# Patient Record
Sex: Female | Born: 1945
Health system: Southern US, Community
[De-identification: ages and names within clinical notes are randomized; demographics above are authoritative.]

## PROBLEM LIST (undated history)

## (undated) DIAGNOSIS — R569 Unspecified convulsions: Secondary | ICD-10-CM

## (undated) DIAGNOSIS — G459 Transient cerebral ischemic attack, unspecified: Secondary | ICD-10-CM

## (undated) DIAGNOSIS — E785 Hyperlipidemia, unspecified: Secondary | ICD-10-CM

## (undated) HISTORY — PX: TONSILLECTOMY: SUR1361

## (undated) HISTORY — DX: Transient cerebral ischemic attack, unspecified: G45.9

## (undated) HISTORY — DX: Hyperlipidemia, unspecified: E78.5

## (undated) HISTORY — PX: EYE SURGERY: SHX253

---

## 1969-03-03 HISTORY — PX: BREAST BIOPSY: SHX20

## 1998-04-03 ENCOUNTER — Other Ambulatory Visit: Admission: RE | Admit: 1998-04-03 | Discharge: 1998-04-03 | Payer: Self-pay | Admitting: Obstetrics & Gynecology

## 1998-05-08 ENCOUNTER — Other Ambulatory Visit: Admission: RE | Admit: 1998-05-08 | Discharge: 1998-05-08 | Payer: Self-pay | Admitting: Obstetrics & Gynecology

## 1999-06-04 ENCOUNTER — Other Ambulatory Visit: Admission: RE | Admit: 1999-06-04 | Discharge: 1999-06-04 | Payer: Self-pay | Admitting: Obstetrics & Gynecology

## 2000-06-24 ENCOUNTER — Other Ambulatory Visit: Admission: RE | Admit: 2000-06-24 | Discharge: 2000-06-24 | Payer: Self-pay | Admitting: Obstetrics & Gynecology

## 2001-09-02 ENCOUNTER — Other Ambulatory Visit: Admission: RE | Admit: 2001-09-02 | Discharge: 2001-09-02 | Payer: Self-pay | Admitting: Obstetrics & Gynecology

## 2003-02-21 ENCOUNTER — Other Ambulatory Visit: Admission: RE | Admit: 2003-02-21 | Discharge: 2003-02-21 | Payer: Self-pay | Admitting: Obstetrics & Gynecology

## 2003-06-19 ENCOUNTER — Inpatient Hospital Stay (HOSPITAL_COMMUNITY): Admission: AD | Admit: 2003-06-19 | Discharge: 2003-06-22 | Payer: Self-pay | Admitting: Family Medicine

## 2003-10-11 ENCOUNTER — Emergency Department (HOSPITAL_COMMUNITY): Admission: EM | Admit: 2003-10-11 | Discharge: 2003-10-11 | Payer: Self-pay | Admitting: Emergency Medicine

## 2004-04-03 ENCOUNTER — Other Ambulatory Visit: Admission: RE | Admit: 2004-04-03 | Discharge: 2004-04-03 | Payer: Self-pay | Admitting: Obstetrics & Gynecology

## 2006-11-10 ENCOUNTER — Ambulatory Visit: Payer: Self-pay | Admitting: Gastroenterology

## 2006-12-22 ENCOUNTER — Ambulatory Visit: Payer: Self-pay | Admitting: Gastroenterology

## 2006-12-22 ENCOUNTER — Encounter: Payer: Self-pay | Admitting: Gastroenterology

## 2006-12-22 ENCOUNTER — Ambulatory Visit (HOSPITAL_COMMUNITY): Admission: RE | Admit: 2006-12-22 | Discharge: 2006-12-22 | Payer: Self-pay | Admitting: Gastroenterology

## 2006-12-22 HISTORY — PX: COLONOSCOPY: SHX174

## 2010-07-16 NOTE — Op Note (Signed)
NAMEALLIZON, WOZNICK                ACCOUNT NO.:  [id number]   MEDICAL RECORD NO.:  [id number]          PATIENT TYPE:  AMB   LOCATION:  DAY                           FACILITY:  [facility]   PHYSICIAN:  Kassie Mends, M.D.      DATE OF BIRTH:  1945/04/16   DATE OF PROCEDURE:  12/22/2006  DATE OF DISCHARGE:                               OPERATIVE REPORT   PROCEDURE:  Colonoscopy with cold forceps and snare cautery polypectomy.   INDICATION FOR EXAM:  Ms. Yassin is a 65 year old female who presents  for heme-positive stool.  She has a second-degree relative that had  colon cancer.   FINDINGS:  8 mm sigmoid colon polyp removed via snare cautery.  Otherwise no masses, inflammatory changes, diverticula or AVMs seen.  No  internal hemorrhoids.   DIAGNOSIS:  Ms. Arenas 8 mm sigmoid colon polyp is the likely cause of  her intermittent rectal bleeding and heme-positive stool.   RECOMMENDATIONS:  1. Will call Ms. Gibbon with results of her biopsies.  If her polyp is      adenomatous, then she needs a screening colonoscopy in 5 years.  If      her polyp is adenomatous then, her first degree relatives need a      screening colonoscopy at age 66 and then every 10 years because her      adenomatous polyp diagnosis would come after the age of 65.  2. She should follow high fiber diet.  She is given handout on high-      fiber diet polyps.  3. No aspirin, NSAIDs or anticoagulation for 7 days.   MEDICATIONS:  1. Demerol 50 mg IV.  2. Versed 4 mg IV.   PROCEDURE TECHNIQUE:  Physical exam was performed.  Informed consent was  obtained from the patient after explaining benefits, risks and  alternatives to the procedure.  The patient connected to monitor and  placed in left lateral position.  Continuous oxygen was provided by  nasal cannula and IV medicine administered through an indwelling  cannula.  After administration of sedation and rectal exam, the  patient's rectum was intubated and the  scope  was advanced under direct visualization to the cecum.  The scope  was removed slowly by carefully examining the color, texture, anatomy  and integrity mucosa on the way out.  The patient was recovered in  endoscopy and discharged home in satisfactory condition.      Kassie Mends, M.D.  Electronically Signed     SM/MEDQ  D:  12/22/2006  T:  12/23/2006  Job:  [id number]   cc:   Kingsley Callander. Ouida Sills, MD  Fax: [phone]

## 2010-07-16 NOTE — Consult Note (Signed)
Kristina [name], Kristina [name]                ACCOUNT NO.:  [id number]   MEDICAL RECORD NO.:  [id number]         PATIENT TYPE:  AMB   LOCATION:  DAY                           FACILITY:  [facility]   PHYSICIAN:  Kassie Mends, M.D.      DATE OF BIRTH:  1945-09-14   DATE OF CONSULTATION:  11/10/2006  DATE OF DISCHARGE:                                 CONSULTATION   PHYSICIAN REQUESTING CONSULTATION:  Kingsley Callander. Ouida Sills, MD   GYNECOLOGIST:  Gerrit Friends. Aldona Bar, MD, [facility]   REASON FOR CONSULTATION:  Blood in stool.   HISTORY OF PRESENT ILLNESS:  [name] is a 65 year old Caucasian female,  patient of Dr. Ouida Sills, who presents today for further evaluation of  recent hemoccult-positive stool.  She presents to schedule colonoscopy.  She had a flexible sigmoidoscopy over 10 years ago.  She does not recall  any abnormalities.  She states she had colitis when she was younger  which resolved with a nerve pill.  It sounds like she may have had IBS.  She has done well throughout the years.  She denies any constipation,  diarrhea, melena or rectal bleeding, abdominal pain, nausea or vomiting.  She rarely has heartburn.  Weight is stable.  She believes her paternal  grandmother had colon cancer.   CURRENT MEDICATIONS:  1. Welchol 1250 mg t.i.d.  2. Voltaren 75 mg p.r.n.  3. Aspirin 81 mg daily.  4. Multivitamin daily.  5. Calcium with vitamin D daily.   ALLERGIES:  STATIN DRUGS.  SHE HAD A TIA ON VYTORIN.  SHE STATES SHE HAS  HAD SOME SHORTNESS OF BREATH WITH ELIDEL.   PAST MEDICAL HISTORY:  1. Hypercholesterolemia.  2. History of TIA affecting the right temporal lacunar region felt to      be due to Vytorin, according to the patient.  This was 4 years ago.  3. Remote colitis, possibly IBS.   PAST SURGICAL HISTORY:  Tonsillectomy.   FAMILY HISTORY:  Mother is 72.  She has osteoporosis, hypertension,  hypercholesterolemia.  Father is 50.  He had lung cancer and skin  cancer.  Maternal grandmother  possibly had colon cancer.   SOCIAL HISTORY:  She is married and has 2 children.  She is employed at  [employer].  She is a nonsmoker.  Occasionally has a glass of wine.   REVIEW OF SYSTEMS:  GI:  See HPI for GI.  CONSTITUTIONAL:  No weight loss.  CARDIOPULMONARY:  No chest pain, shortness of breath, palpitations,  chronic cough.  GENITOURINARY:  No dysuria or hematuria.   PHYSICAL EXAMINATION:  VITAL SIGNS:  Weight - 165.  Height - 5 feet 4  inches.  Temperature 98.6 degrees, blood pressure 120/86, pulse 72.  GENERAL:  Pleasant, well-nourished, well-developed, Caucasian female in  no acute distress.  SKIN:  Warm and dry.  No jaundice.  HEENT:  Sclerae are nonicteric.  Oropharyngeal mucosa moist and pink.  No lesions, erythema or exudate.  No lymphadenopathy or thyromegaly.  CHEST:  Lungs clear to auscultation.  CARDIAC:  Regular rate and rhythm.  Normal S1, S2.  No murmurs,  rubs or  gallops.  ABDOMEN:  Positive bowel sounds.  Abdomen is soft, nondistended,  nontender.  No organomegaly or masses.  No rebound tenderness or  guarding.  No abdominal bruits or hernias.  EXTREMITIES:  No edema.   IMPRESSION:  The patient is a 65 year old lady with recent hemoccult-  positive stools in the setting of Voltaren and aspirin use.  She uses  Voltaren only on an as-needed basis however.  She has never had a  complete colonoscopy.  Given recent hemoccult-positive stool, recommend  diagnostic colonoscopy in the near future.  The patient requests  specifically the pill prep.  There is no contraindication for her  receiving this type of bowel prep.   PLAN:  1. Colonoscopy +/- EGD with Dr. Cira Servant in the near future.  2. Will hold her aspirin for 4 days prior to procedure.   I would like to thank Dr. Carylon Perches for allowing [place] to take part in the  care of this patient.      Tana Coast, P.A.      Kassie Mends, M.D.  Electronically Signed    LL/MEDQ  D:  11/10/2006  T:  11/11/2006  Job:   [id number]   cc:   Gerrit Friends. Aldona Bar, M.D.  Fax: [phone]   Kingsley Callander. Ouida Sills, MD  Fax: [phone]

## 2010-07-19 NOTE — H&P (Signed)
NAME:  Kristina [name], MARCELLI NO.:  [id number]   MEDICAL RECORD NO.:  [id number]                   PATIENT TYPE:  INP   LOCATION:  A215                                 FACILITY:  [facility]   PHYSICIAN:  Vida Roller, M.D.                DATE OF BIRTH:  [birth year]   DATE OF ADMISSION:  06/19/2003  DATE OF DISCHARGE:                                HISTORY & PHYSICAL   PRIMARY CARE PHYSICIAN:  Dr. Yetta Numbers in [place], [place].   HISTORY OF PRESENT ILLNESS:  Ms. Dicenso is a patient that I follow in the  cardiology clinic who has hyperlipidemia and is under treatment for this.  She was working in her yard on Saturday morning with her husband when she  had sudden loss of memory while she was working doing some gardening work.  She does not describe any discomfort, any loss of neurologic function, but  has a period of over two and a half hours where she has no recollection of  the events that occurred.  Her husband describes the event as saying that  she was very, very emotionally distraught with crying and unable to really  give a description of why she was crying.  He brought her into the house and  sat her down.  She did not appear to have any significant neurologic  abnormalities.  He contacted the P.A. on call for [facility] who  reassured him that the likelihood of any significant problem was low.  Unfortunately, he choose not to bring the patient to the hospital at that  point under the recommendations of the P.A. and he contacted his neighbor,  who is an emergency medical technician who evaluated her and felt that there  was no evidence neurologically of a stroke and she spent the weekend  basically without any significant activity and had no progression of her  symptoms.  She still does not remember the events that occurred outside  during the gardening episode.  She does, however, describe a little bit of a  headache prior to the  evaluation, which is still persistent in the center of  her head, a little bit like pressure.  She has no numbness anywhere.  She  has no particular sense of unsteadiness.  There are no visual disturbances.  She does not have any numbness or loss of function in her extremities.  She  does report occasionally getting numb hands, but this is not anything that  has been particularly significant in the past.   PAST MEDICAL HISTORY:  Hyperlipidemia, for which she is under my care.  Her  initial set of cholesterol numbers had a total of 255 with triglycerides of  196, HDL of 49 and LDL of 167.  We started her on a low dose of Vytorin 10  and 20 and had significant improvement in her cholesterol numbers and we had  adjusted  her Vytorin up to 10 and 40 the weekend prior to the onset of the  symptoms.  Her other medications include loratadine 10 mg q.h.s. and  Voltaren 75 mg b.i.d.  She occasionally takes Tylenol on a p.r.n. basis.  She is allergic to ELAVIL.  The only other significant is to spontaneous  vaginal deliveries.   FAMILY HISTORY:  Significant for her father being alive at age 42 and has  borderline diabetes and hypertension.  Her mother is alive at age 7 and has  high blood pressure and hyperlipidemia and had a TIA in the past.  She has  one sister who has high blood pressure at age 68 and another sister age 11  without any blood pressure problems.   She is an area Transport planner.  She does like to do yard work.  She is  married.  Her husband is a Office manager guard here at the hospital and is a very  good historian, between the two of them.   REVIEW OF SYSTEMS:  Otherwise noncontributory, except for that reviewed in  the history of present illness.  She does not smoke cigarettes.  She does  not drink any alcohol.  She does not use any illicit drugs.   PHYSICAL EXAMINATION:  GENERAL:  She is a well-developed, well-nourished  white female in no apparent distress who is alert and  oriented times four  and a very good historian.  VITAL SIGNS:  Her blood pressure is 134/90 in her right arm and 136/92 in  her left; her pulse is 52 and regular; she weighs 172 pounds.  HEAD, EARS, EYES, NOSE AND THROAT:  Completely normal.  Funduscopic exam  does not reveal any palpable edema.  Her extraocular movements are intact.  She is normocephalic, atraumatic.  Her pupils are equal, round and reactive  to light and accommodation.  Her cranial nerves II-XII are totally normal  with the exception of some abnormalities in gaze.  With her glasses off she  appears to have some mild one beat nystagmus when moving from a left lateral  to right lateral movement, but otherwise appears to have full range of  motion otherwise.  NECK:  Supple.  There is no jugulovenous distension or carotid bruits.  Her  thyroid appears to be normal size in the midline.  CHEST:  Clear to auscultation bilaterally.  CARDIOVASCULAR:  Regular rate and rhythm with no murmurs, rubs or gallops.  BREASTS:  I did not do a breast exam.  ABDOMEN:  Soft, nontender with normoactive bowel sounds.  EXTREMITIES:  Her pulses are all 2+ throughout without any bruits.  NEUROLOGIC:  A detailed neurologic exam is completely within normal limits.   We have not performed an electrocardiogram.   ASSESSMENT:  It is my opinion that this lady had a TIA and is quite  concerning as she still continues to have a headache.  I am concerned enough  about this situation that I am going to admit her to the hospital to get a  head CT and potentially a head MRI.  I am going to ask for a neurologic  consultation.  She will need an echocardiogram and a set of carotid Dopplers  as well as screening laboratories to include a thyroid function study.  I  have discussed this with her primary care physician, Dr. Artis Delay, who  agrees to admit her to the hospital under his service.     ___________________________________________  Vida Roller, M.D.   JH/MEDQ  D:  06/19/2003  T:  06/19/2003  Job:  [id number]

## 2010-07-19 NOTE — Procedures (Signed)
NAME:  IOANNA, COLQUHOUN NO.:  [id number]   MEDICAL RECORD NO.:  [id number]                   PATIENT TYPE:  INP   LOCATION:  A215                                 FACILITY:  [facility]   PHYSICIAN:  Hatfield Bing, M.D.               DATE OF BIRTH:  1945-10-24   DATE OF PROCEDURE:  06/20/2003  DATE OF DISCHARGE:                                  ECHOCARDIOGRAM   REFERRING PHYSICIAN:  1. Corrie Mckusick.  2. Vida Roller.   INDICATIONS:  A 65 year old woman with TIA.   M-MODE:  1. Aorta 2.5.  2. Left atrium 2.9.  3. Septum 1.3.  4. Posterior wall 1.0.  5. LV diastole 3.7.  6. LV systole 2.3.   FINDINGS:  1. A technically-adequate echocardiographic study.  2. Normal left atrium, right atrium and right ventricle.  3. Minimal aortic valvular sclerosis, slight aortic annular calcification.  4. Normal mitral valve; mild annular calcification.  5. Normal tricuspid valve.  6. Trace regurgitation.  7. Normal internal dimension of the left ventricle; borderline LVH.  8. Normal regional and global LV systolic function.  9. Normal Doppler examination.      ___________________________________________                                            [place] Bing, M.D.   RR/MEDQ  D:  06/20/2003  T:  06/21/2003  Job:  [id number]

## 2010-07-19 NOTE — H&P (Signed)
NAME:  Kristina [name], Kristina [name] NO.:  [id number]   MEDICAL RECORD NO.:  [id number]                   PATIENT TYPE:  INP   LOCATION:  A215                                 FACILITY:  [facility]   PHYSICIAN:  Kirk Ruths, M.D.            DATE OF BIRTH:  Oct 21, 1945   DATE OF ADMISSION:  06/19/2003  DATE OF DISCHARGE:                                HISTORY & PHYSICAL   CHIEF COMPLAINT:  Memory loss.   HISTORY OF PRESENT ILLNESS:  This is a 65 year old white female who is  admitted through Dr. Elissa Hefty office.  Patient states that over the  weekend, on June 17, 2003, patient had taken her new medication, which was  Vytorin 10/40, which had just been increased from 10/20.  She also stated  she had some mild headache and took some aspirin as well as her Voltaren,  which she takes for osteoarthritis.  Patient went out to work in the yard  with her husband and subsequently had an approximately hour to hour and a  half of memory loss.  She states that her husband states that she was  sitting on the bench sobbing, which she says she usually does when she is  angry.  She did not know how she had gotten where she was.  The patient was  taken into the house, where she was, as she describes, in a fog.  Approximately another hour and a half or so, at which time she was having  intermittent memory return and some high anxiety related to this incident.  The patient was seen by Dr. Herbie Baltimore, and he felt that she could possibly  have had a TIA and admitted her to my service.   ALLERGIES:  Patient is allergic to ELAVIL.   CURRENT MEDICATIONS:  Include only Vytorin, as mentioned above, as well as  Voltaren for arthritis and an aspirin daily.   PAST MEDICAL HISTORY:  Hyperlipidemia is the patient's only significant  diagnosis.   REVIEW OF SYSTEMS:  Denies vomiting, chest pain, shortness of breath.  Patient states that she does have some mild nausea and mild headache.   PHYSICAL EXAMINATION:  VITAL SIGNS:  Patient is afebrile.  Blood pressure  140/80, respirations 20, pulse 70 and regular.  GENERAL:  A well-developed white female in no severe distress.  HEENT:  TM's are normal.  Pupils are equal and reactive to light and  accommodation.  Oropharynx benign.  NECK:  Supple without JVD, bruits, or thyromegaly.  LUNGS:  Clear in all areas.  HEART:  Regular rate and rhythm without murmur, rub or gallop.  ABDOMEN:  Soft and nontender.  EXTREMITIES:  Without clubbing, cyanosis or edema.  NEUROLOGIC:  Grossly intact.  Motor, sensory, and reflexes are all  bilaterally equal and full.   ASSESSMENT:  Memory loss, possibly secondary to transient ischemic attack.  Possibly atypical migraine.  Possible Vytorin drug-related.   PLAN:  Patient will be admitted for a CT of the brain, carotid Dopplers, as  well as echocardiogram.  Consult Dr. Gerilyn Pilgrim from neurology and hold  Vytorin at this time.     ___________________________________________                                         Kirk Ruths, M.D.   WMM/MEDQ  D:  06/20/2003  T:  06/20/2003  Job:  [id number]

## 2010-07-19 NOTE — Group Therapy Note (Signed)
NAME:  Kristina [name], Kristina [name]                          ACCOUNT NO.:  [id number]   MEDICAL RECORD NO.:  [id number]                   PATIENT TYPE:  INP   LOCATION:  A215                                 FACILITY:  [facility]   PHYSICIAN:  Kofi A. Gerilyn Pilgrim, M.D.              DATE OF BIRTH:  May 24, 1945   DATE OF PROCEDURE:  06/22/2003  DATE OF DISCHARGE:  06/22/2003                                   PROGRESS NOTE   The patient has had no recurrent spells of amnesia.  She continues to report  problems with abdominal discomfort and nausea.  She also actually has a lot  of anxiety about her clinical condition.  The patient was actually set for  discharge after having this MRI scan then we ordered EEG that was not done,  had an extensive discussion with the family and we decided to let the  patient go home and come to our office later this evening to have an EEG  done.  The results of the EEG were reviewed and there is no abnormal  epileptiform discharges seen, essentially EEG was normal.  The brain MRI was  reviewed and it shows an acute lacunar-type infarct involving the left  mesiotemporal lobe.   ASSESSMENT AND PLAN:  I had an extensive discussion with the family and  review of the data.  I believe the MRI findings with the signal seen on  diffusion-weighted sequence/images supports a diagnosis of lacunar-type  infarct.  The location I agree is apical for a lacunar infarct.  The  location of the mesiotemporal lobe is important for memory and I believe  likely explains her abrupt amnesia associated with her infarct.  The  location is also classic for temporal lobe epilepsy but EEG was totally  normal.  My recommendation is for the patient to continue with antiplatelet  agents - at this time really antiplatelet agent is sufficient as she does  not represent a failure of either Plavix or aspirin.  She does not need  antiepileptic medication.  I would suggest that she avoid the Vytorin or  Zocor as there  is a question mark of whether or not these medications may  have triggered these events.  The husband reported that the patient had some  problems remembering particular short-term memory problems of 6 weeks as she  was taking the Vytorin.  No driving restrictions are needed at this time.  She may return to work in early May.      ___________________________________________                                            Perlie Gold. Gerilyn Pilgrim, M.D.   KAD/MEDQ  D:  06/22/2003  T:  06/22/2003  Job:  [id number]

## 2010-07-19 NOTE — Discharge Summary (Signed)
NAME:  Kristina [name], Kristina [name] NO.:  [id number]   MEDICAL RECORD NO.:  [id number]                   PATIENT TYPE:  INP   LOCATION:  A215                                 FACILITY:  [facility]   PHYSICIAN:  Kirk Ruths, M.D.            DATE OF BIRTH:  05-23-1945   DATE OF ADMISSION:  06/19/2003  DATE OF DISCHARGE:  06/22/2003                                 DISCHARGE SUMMARY   FINAL DIAGNOSES:  1. Amnesia secondary to lacunar infarct versus medication complication.  2. History of hyperlipidemia.   HOSPITAL COURSE:  This is a 65 year old white female who reported having  amnesia of some several hours two days before admission.  It is interesting  to note that her Vytorin had been increased or doubled from 10/20 to 10/40  the day before this episode.  The patient was seeing Dr. Dorethea Clan for a  cardiology followup where she reported this event.  He felt that she needed  to be followed up for a TIA.  The patient was admitted to the floor.  A CT  scan was obtained which was negative.  The patient also underwent carotid  Dopplers.  Although there were some mild calcific changes in the right ICA  proximally, no hemodynamically significant stenosis was seen.  Echocardiogram was normal.  The patient's CBC and MET7 as well as thyroid  studies were normal.  EKG was normal also.  The patient was stable  throughout her stay, having no amnestic spells.  She was seen in  consultation by Dr. Gerilyn Pilgrim.  She underwent EEG as well as MRI.  EEG was  perfectly normal.  MRI suggested a lacunar infarct in the right temporal  area.  It was actually a tiny abnormal focus on the medial aspect of the  left temporal lobe.  The patient, again, was stable throughout her stay  without any neurological problems.  The patient's final diagnosis is still  uncertain.  Dr. Gerilyn Pilgrim feels that is probably a TIA with a lacunar infarct  and wants the patient treated with Plavix daily but also suggested  avoiding  Zocor because it has been associated rarely with amnestic side effects, and  this did occur the day after her dose was increased.     ___________________________________________                                         Kirk Ruths, M.D.   WMM/MEDQ  D:  07/04/2003  T:  07/05/2003  Job:  [id number]

## 2010-07-19 NOTE — Consult Note (Signed)
NAME:  Kristina [name], Kristina [name]                          ACCOUNT NO.:  [id number]   MEDICAL RECORD NO.:  [id number]                   PATIENT TYPE:  INP   LOCATION:  A215                                 FACILITY:  [facility]   PHYSICIAN:  Kofi A. Gerilyn Pilgrim, M.D.              DATE OF BIRTH:  March 06, 1945   DATE OF CONSULTATION:  DATE OF DISCHARGE:                                   CONSULTATION   REFERRING PHYSICIAN:  Dr. Kirk Ruths.   IMPRESSION:  Transient neurological deficit/amnesia.  The differential  diagnosis includes a transient ischemic attack or even a frank  cerebrovascular event, seizures, transient global amnesia, complex migraine  and medication effect, somewhat unclear which one may be a leading  diagnosis.  Given the close proximity of this event occurring with increase  of the Vytorin, I would suspect that this may be related to a medication  effect.  There apparently have been some reports of transient global amnesia  occurring with statin medications.   RECOMMENDATION:  1. EEG.  2. Brain MRI.   HISTORY:  This is a 65 year old Caucasian female who has a history of  dyslipidemia and apparently has been on Lipitor.  This medication was  switched to Vytorin.  She was taking the 10/20 dose.  This medication was  increased last Friday.  The following day, the patient had an event; she, in  fact, took the medication on a Friday night and had the event the following  morning after taking Tylenol apparently for some arthritic pain.  The  patient was found sitting outside in a chair sobbing and crying.  She had no  recollection of what happened.  Her husband reports that the patient seemed  to be fine; she was talking and was responsive; there was no slurred speech  reported; no focal neurologic deficits were reported.  The patient, however,  has very scant recollection for approximately 2 hours.  When the patient  eventually regained full memory, she reports complaining of  headache  involving the total head.  She continues to have a headache but today it is  localized to the right periorbital/frontotemporal region.  She reports  feeling nauseated after the event and continues to be feeling nauseated.  The patient denies any oral trauma or urinary incontinence.  Again, no focal  neurologic deficits are reported.  The patient denies a history of stroke in  the past.  There is no history of meningitis, encephalitis, developmental  delay or seizures.  There is no family history of seizures.  The patient  sustained a head injury at the age of 54 from a blunt object.  She  apparently did not lose consciousness but for approximately 2 hours, was not  able to talk.  This was followed by some emesis and nausea and she suddenly  improved.  She has had no problem related to that episode.   PAST MEDICAL HISTORY:  Hyperlipidemia.   ALLERGIES:  ELAVIL.   ADMISSION MEDICATIONS:  Vytorin, Voltaren, Tylenol, aspirin, Claritin over  the counter.   REVIEW OF SYSTEMS:  No chest pain or shortness of breath reported.  The  patient does not report a history of headaches.  She reports having  occasional headache associated with sinus problems which usually resolve  with over-the-counter sinus medication.   PHYSICAL EXAMINATION:  VITAL SIGNS:  She has been afebrile, temperature  97.6; pulse 64; respirations 20; blood pressure 113/65.  NECK:  Neck is supple.  LUNGS:  Lungs are clear to auscultation bilaterally.  CARDIOVASCULAR:  Exam revealed a normal S1 and S2.  EXTREMITIES:  There is no extremity edema or varicosities.  NEUROLOGIC:  She is awake, alert.  Her language is intact, her naming is  intact, comprehension and fluency are normal.  Cognition is generally  unremarkable.  No dysarthria is noted.  Cranial nerve evaluation shows that  she has anisocoria with the right pupil being 5 mm, left being 4; the  difference between both does not seem to change from light or dark.   She  also seemed to have some mild cataracts bilaterally, somewhat more in the  right side.  Visual fields are intact.  Funduscopic examination shows  healthy disks.  There are spontaneous venous pulsations seen.  Extraocular  movements are intact.  Visual fields are full.  Facial muscle strength is  normal.  Tongue is midline.  Uvula is midline.  Shoulder shrugs are normal.  Motor examination shows normal tone, bulk and strength.  There is no  pronator drift.  Coordination is intact.  Reflexes are +2 and symmetric.  Plantar reflexes are both downgoing.  Sensory examination:  Normal  temperature and light touch.  Gait is normal.   IMAGING STUDIES:  CT scan of the brain is normal, no acute process seen.   Carotid Duplex Doppler is essentially unremarkable.  There are some calcific  changes seen in the right ICA proximally; no hemodynamically significant  stenosis seen, however.      ___________________________________________                                            Darleen Crocker A. Gerilyn Pilgrim, M.D.   KAD/MEDQ  D:  06/20/2003  T:  06/21/2003  Job:  [id number]

## 2011-11-20 ENCOUNTER — Encounter: Payer: Self-pay | Admitting: *Deleted

## 2012-01-15 DIAGNOSIS — E785 Hyperlipidemia, unspecified: Secondary | ICD-10-CM | POA: Diagnosis not present

## 2012-01-15 DIAGNOSIS — Z79899 Other long term (current) drug therapy: Secondary | ICD-10-CM | POA: Diagnosis not present

## 2012-01-15 DIAGNOSIS — M1990 Unspecified osteoarthritis, unspecified site: Secondary | ICD-10-CM | POA: Diagnosis not present

## 2012-01-22 DIAGNOSIS — Z23 Encounter for immunization: Secondary | ICD-10-CM | POA: Diagnosis not present

## 2012-01-22 DIAGNOSIS — E785 Hyperlipidemia, unspecified: Secondary | ICD-10-CM | POA: Diagnosis not present

## 2012-02-11 ENCOUNTER — Encounter: Payer: Self-pay | Admitting: Gastroenterology

## 2012-02-12 ENCOUNTER — Ambulatory Visit (INDEPENDENT_AMBULATORY_CARE_PROVIDER_SITE_OTHER): Payer: Medicare Other | Admitting: Gastroenterology

## 2012-02-12 ENCOUNTER — Other Ambulatory Visit: Payer: Self-pay | Admitting: Gastroenterology

## 2012-02-12 ENCOUNTER — Encounter: Payer: Self-pay | Admitting: Gastroenterology

## 2012-02-12 VITALS — BP 126/75 | HR 70 | Temp 97.4°F | Ht 64.0 in | Wt 179.2 lb

## 2012-02-12 DIAGNOSIS — Z86010 Personal history of colonic polyps: Secondary | ICD-10-CM

## 2012-02-12 DIAGNOSIS — D126 Benign neoplasm of colon, unspecified: Secondary | ICD-10-CM | POA: Diagnosis not present

## 2012-02-12 MED ORDER — SOD PICOSULFATE-MAG OX-CIT ACD 10-3.5-12 MG-GM-GM PO PACK: 1.0000 | PACK | ORAL | Status: DC

## 2012-02-12 NOTE — Progress Notes (Signed)
Faxed to PCP

## 2012-02-12 NOTE — Progress Notes (Signed)
Primary Care Physician:  Carylon Perches, MD  Primary Gastroenterologist:  Jonette Eva, MD   Chief Complaint  Patient presents with  . Colonoscopy    HPI:  Kristina [name] is a 66 y.o. female here to schedule surveillance colonoscopy for h/o tubulovillous adenomas of the colon on colonoscopy by Dr. Darrick Penna in 2008. Denies heartburn, dysphagia, abdominal pain, constipation, diarrhea, melena, brbpr, n/v, weight loss. Takes Mobic daily for arthritis.    Current Outpatient Prescriptions  Medication Sig Dispense Refill  . cetirizine (ZYRTEC) 10 MG tablet Take 10 mg by mouth daily.      . CRESTOR 10 MG tablet Take 10 mg by mouth daily.       . meloxicam (MOBIC) 7.5 MG tablet Take 7.5 mg by mouth daily.         Allergies as of 02/12/2012  . (No Known Allergies)    Past Medical History  Diagnosis Date  . Hyperlipidemia   . TIA (transient ischemic attack)     Past Surgical History  Procedure Date  . Colonoscopy 12/22/2006    SLF:  8 mm sigmoid colon polyp removed via snare cautery/ Otherwise no masses, inflammatory changes, diverticula , no internal hemorrhoids. Path-tubulovillous adenomas  . Tonsillectomy     Family History  Problem Relation Age of Onset  . Lung cancer Father   . Colon cancer Other     ?maternal grandmother may have had colon cancer  . Colon polyps Father     History   Social History  . Marital Status: Married    Spouse Name: N/A    Number of Children: 2  . Years of Education: N/A   Occupational History  . [employer]    Social History Main Topics  . Smoking status: Never Smoker   . Smokeless tobacco: Not on file  . Alcohol Use: No  . Drug Use: No  . Sexually Active: Not on file   Other Topics Concern  . Not on file   Social History Narrative  . No narrative on file      ROS:  General: Negative for anorexia, weight loss, fever, chills, fatigue, weakness. Eyes: Negative for vision changes.  ENT: Negative for hoarseness, difficulty swallowing ,  nasal congestion. CV: Negative for chest pain, angina, palpitations, dyspnea on exertion, peripheral edema.  Respiratory: Negative for dyspnea at rest, dyspnea on exertion, cough, sputum, wheezing.  GI: See history of present illness. GU:  Negative for dysuria, hematuria, urinary incontinence, urinary frequency, nocturnal urination.  MS: chronic joint pain, negative for low back pain.  Derm: Negative for rash or itching.  Neuro: Negative for weakness, abnormal sensation, seizure, frequent headaches, memory loss, confusion.  Psych: Negative for anxiety, depression, suicidal ideation, hallucinations.  Endo: Negative for unusual weight change.  Heme: Negative for bruising or bleeding. Allergy: Negative for rash or hives.    Physical Examination:  BP 126/75  Pulse 70  Temp 97.4 F (36.3 C) (Temporal)  Ht 5\' 4"  (1.626 m)  Wt 179 lb 3.2 oz (81.285 kg)  BMI 30.76 kg/m2   General: Well-nourished, well-developed in no acute distress.  Head: Normocephalic, atraumatic.   Eyes: Conjunctiva pink, no icterus. Mouth: Oropharyngeal mucosa moist and pink , no lesions erythema or exudate. Neck: Supple without thyromegaly, masses, or lymphadenopathy.  Lungs: Clear to auscultation bilaterally.  Heart: Regular rate and rhythm, no murmurs rubs or gallops.  Abdomen: Bowel sounds are normal, nontender, nondistended, no hepatosplenomegaly or masses, no abdominal bruits or    hernia , no rebound or  guarding.   Rectal: defer Extremities: No lower extremity edema. No clubbing or deformities.  Neuro: Alert and oriented x 4 , grossly normal neurologically.  Skin: Warm and dry, no rash or jaundice.   Psych: Alert and cooperative, normal mood and affect.

## 2012-02-12 NOTE — Patient Instructions (Addendum)
We have scheduled you for a colonoscopy with Dr. Darrick Penna. See separate instructions.

## 2012-02-12 NOTE — Assessment & Plan Note (Signed)
H/O tubulovillous adenomas of colon removed in 2008. Due for surveillance colonoscopy with Dr. Darrick Penna.  I have discussed the risks, alternatives, benefits with regards to but not limited to the risk of reaction to medication, bleeding, infection, perforation and the patient is agreeable to proceed. Written consent to be obtained.

## 2012-02-18 ENCOUNTER — Telehealth: Payer: Self-pay

## 2012-02-18 NOTE — Telephone Encounter (Signed)
Called to let pt's daughter know her mom's date of procedure is 03/16/2012   Not 03/12/2012. My mistake. She was moved to 11:00 AM that day per Selena Batten. (Had to add an OR procedure). She is aware to be there at Short Stay at 10:00 AM.)

## 2012-02-18 NOTE — Telephone Encounter (Signed)
I called pt to inform that her procedure time has been changed to 11:00 AM on 03/12/2012 and she will need to be at Short Stay at 10:00. She was at work and I informed her daughter, Victorino Dike, who said that she would let her know.

## 2012-03-04 ENCOUNTER — Encounter (HOSPITAL_COMMUNITY): Payer: Self-pay | Admitting: Pharmacy Technician

## 2012-03-15 DIAGNOSIS — N390 Urinary tract infection, site not specified: Secondary | ICD-10-CM | POA: Diagnosis not present

## 2012-03-16 ENCOUNTER — Ambulatory Visit (HOSPITAL_COMMUNITY)
Admission: RE | Admit: 2012-03-16 | Discharge: 2012-03-16 | Disposition: A | Discharge status: Home / Self Care | Payer: Medicare Other | Source: Ambulatory Visit | Attending: Gastroenterology | Admitting: Gastroenterology

## 2012-03-16 ENCOUNTER — Encounter (HOSPITAL_COMMUNITY): Payer: Self-pay | Admitting: *Deleted

## 2012-03-16 ENCOUNTER — Encounter (HOSPITAL_COMMUNITY)
Admission: RE | Disposition: A | Discharge status: Home / Self Care | Payer: Self-pay | Source: Ambulatory Visit | Attending: Gastroenterology

## 2012-03-16 DIAGNOSIS — K648 Other hemorrhoids: Secondary | ICD-10-CM | POA: Diagnosis not present

## 2012-03-16 DIAGNOSIS — Z1211 Encounter for screening for malignant neoplasm of colon: Secondary | ICD-10-CM | POA: Insufficient documentation

## 2012-03-16 DIAGNOSIS — Z86010 Personal history of colonic polyps: Secondary | ICD-10-CM

## 2012-03-16 DIAGNOSIS — K5730 Diverticulosis of large intestine without perforation or abscess without bleeding: Secondary | ICD-10-CM | POA: Insufficient documentation

## 2012-03-16 HISTORY — PX: COLONOSCOPY: SHX5424

## 2012-03-16 SURGERY — COLONOSCOPY
Anesthesia: Moderate Sedation

## 2012-03-16 MED ORDER — SODIUM CHLORIDE 0.45 % IV SOLN
INTRAVENOUS | Status: DC
  Administered 2012-03-16: 10:00:00 via INTRAVENOUS

## 2012-03-16 MED ORDER — MIDAZOLAM HCL 5 MG/5ML IJ SOLN
INTRAMUSCULAR | Status: DC | PRN
  Administered 2012-03-16 (×2): 2 mg via INTRAVENOUS

## 2012-03-16 MED ORDER — MIDAZOLAM HCL 5 MG/5ML IJ SOLN
INTRAMUSCULAR | Status: AC
  Filled 2012-03-16: qty 10

## 2012-03-16 MED ORDER — MEPERIDINE HCL 100 MG/ML IJ SOLN
INTRAMUSCULAR | Status: DC | PRN
  Administered 2012-03-16 (×2): 25 mg via INTRAVENOUS

## 2012-03-16 MED ORDER — MEPERIDINE HCL 100 MG/ML IJ SOLN
INTRAMUSCULAR | Status: AC
  Filled 2012-03-16: qty 1

## 2012-03-16 MED ORDER — STERILE WATER FOR IRRIGATION IR SOLN
Status: DC | PRN
  Administered 2012-03-16: 11:00:00

## 2012-03-16 NOTE — H&P (Signed)
  Primary Care Physician:  Carylon Perches, MD Primary Gastroenterologist:  Dr. Darrick Penna  Pre-Procedure History & Physical: HPI:  Kristina [name] is a 67 y.o. female here for  PERSONAL HISTORY OF POLYPS.  Past Medical History  Diagnosis Date  . Hyperlipidemia   . TIA (transient ischemic attack)     Past Surgical History  Procedure Date  . Colonoscopy 12/22/2006    SLF:  8 mm sigmoid colon polyp removed via snare cautery/ Otherwise no masses, inflammatory changes, diverticula , no internal hemorrhoids. Path-tubulovillous adenomas  . Tonsillectomy     Prior to Admission medications   Medication Sig Start Date End Date Taking? Authorizing Provider  cetirizine (ZYRTEC) 10 MG tablet Take 10 mg by mouth daily.   Yes Historical Provider, MD  CRESTOR 10 MG tablet Take 10 mg by mouth daily.  12/17/11  Yes Historical Provider, MD  meloxicam (MOBIC) 7.5 MG tablet Take 7.5 mg by mouth daily.  02/09/12  Yes Historical Provider, MD  nitrofurantoin, macrocrystal-monohydrate, (MACROBID) 100 MG capsule Take 100 mg by mouth 2 (two) times daily.   Yes Historical Provider, MD    Allergies as of 02/12/2012  . (No Known Allergies)    Family History  Problem Relation Age of Onset  . Lung cancer Father   . Colon cancer Other     ?maternal grandmother may have had colon cancer  . Colon polyps Father     History   Social History  . Marital Status: Married    Spouse Name: N/A    Number of Children: 2  . Years of Education: N/A   Occupational History  . [employer]    Social History Main Topics  . Smoking status: Never Smoker   . Smokeless tobacco: Not on file  . Alcohol Use: No  . Drug Use: No  . Sexually Active: Not on file   Other Topics Concern  . Not on file   Social History Narrative  . No narrative on file    Review of Systems: See HPI, otherwise negative ROS   Physical Exam: BP 127/81  Pulse 76  Temp 98.2 F (36.8 C) (Oral)  Resp 13  Ht 5\' 3"  (1.6 m)  Wt 170 lb (77.111 kg)   BMI 30.11 kg/m2  SpO2 95% General:   Alert,  pleasant and cooperative in NAD Head:  Normocephalic and atraumatic. Neck:  Supple; Lungs:  Clear throughout to auscultation.    Heart:  Regular rate and rhythm. Abdomen:  Soft, nontender and nondistended. Normal bowel sounds, without guarding, and without rebound.   Neurologic:  Alert and  oriented x4;  grossly normal neurologically.  Impression/Plan:    PERSONAL HISTORY OF ADVANCED POLYP.  PLAN:  1. TCS TODAY

## 2012-03-16 NOTE — Op Note (Signed)
[facility] [address] [place], [address]   COLONOSCOPY PROCEDURE REPORT  PATIENT: Kristina [name], Kristina [name]  MR#: [id number] BIRTHDATE: 09-29-1945 , 66  yrs. old GENDER: Female ENDOSCOPIST: Jonette Eva, MD REFERRED Raphael Gibney, M.D. PROCEDURE DATE:  03/16/2012 PROCEDURE:   Colonoscopy, screening INDICATIONS:High risk patient with personal history of colonic polyps. 8 MM TVA SIGMOID COLON REMOVED IN 2008. MEDICATIONS: Demerol 50 mg IV and Versed 4 mg IV  DESCRIPTION OF PROCEDURE:    Physical exam was performed.  Informed consent was obtained from the patient after explaining the benefits, risks, and alternatives to procedure.  The patient was connected to monitor and placed in left lateral position. Continuous oxygen was provided by nasal cannula and IV medicine administered through an indwelling cannula.  After administration of sedation and rectal exam, the patients rectum was intubated and the EC-[id number] ([id number])  colonoscope was advanced under direct visualization to the cecum.  The scope was removed slowly by carefully examining the color, texture, anatomy, and integrity mucosa on the way out.  The patient was recovered in endoscopy and discharged home in satisfactory condition.    COLON FINDINGS: Moderate diverticulosis was noted in the sigmoid colon.  , The colon mucosa was otherwise normal.  , and Moderate sized internal hemorrhoids were found.  PREP QUALITY: good. CECAL W/D TIME: 11 minutes COMPLICATIONS: None  ENDOSCOPIC IMPRESSION: 1.   Moderate diverticulosis was noted in the sigmoid colon 2.   The colon mucosa was otherwise normal 3.   Moderate sized internal hemorrhoids   RECOMMENDATIONS: HIGH FIBER DIET TCS IN 5 YEARS ALL FIRST DEGREE RELATIVES NEED TCS AT AGE 58 AND EVERY 5 YEARS       _______________________________ Rosalie DoctorJonette Eva, MD 03/16/2012 1:34 PM

## 2012-03-17 ENCOUNTER — Ambulatory Visit: Payer: Self-pay | Admitting: Gastroenterology

## 2012-03-18 ENCOUNTER — Encounter (HOSPITAL_COMMUNITY): Payer: Self-pay | Admitting: Gastroenterology

## 2012-05-10 ENCOUNTER — Other Ambulatory Visit: Payer: Self-pay | Admitting: Obstetrics & Gynecology

## 2012-05-10 DIAGNOSIS — Z124 Encounter for screening for malignant neoplasm of cervix: Secondary | ICD-10-CM | POA: Diagnosis not present

## 2012-05-10 DIAGNOSIS — Z1231 Encounter for screening mammogram for malignant neoplasm of breast: Secondary | ICD-10-CM

## 2012-05-17 ENCOUNTER — Encounter (HOSPITAL_COMMUNITY): Payer: Self-pay | Admitting: Emergency Medicine

## 2012-05-17 ENCOUNTER — Observation Stay (HOSPITAL_COMMUNITY)
Admission: EM | Admit: 2012-05-17 | Discharge: 2012-05-20 | Disposition: A | Discharge status: Home / Self Care | Payer: Medicare Other | Source: Ambulatory Visit | Attending: Internal Medicine | Admitting: Internal Medicine

## 2012-05-17 ENCOUNTER — Emergency Department (HOSPITAL_COMMUNITY): Payer: Medicare Other

## 2012-05-17 DIAGNOSIS — E785 Hyperlipidemia, unspecified: Secondary | ICD-10-CM | POA: Diagnosis not present

## 2012-05-17 DIAGNOSIS — R5381 Other malaise: Secondary | ICD-10-CM | POA: Diagnosis not present

## 2012-05-17 DIAGNOSIS — I6789 Other cerebrovascular disease: Secondary | ICD-10-CM | POA: Diagnosis not present

## 2012-05-17 DIAGNOSIS — I10 Essential (primary) hypertension: Secondary | ICD-10-CM | POA: Insufficient documentation

## 2012-05-17 DIAGNOSIS — R413 Other amnesia: Secondary | ICD-10-CM | POA: Diagnosis not present

## 2012-05-17 DIAGNOSIS — R4182 Altered mental status, unspecified: Secondary | ICD-10-CM | POA: Diagnosis not present

## 2012-05-17 DIAGNOSIS — G459 Transient cerebral ischemic attack, unspecified: Secondary | ICD-10-CM | POA: Diagnosis not present

## 2012-05-17 DIAGNOSIS — Z0389 Encounter for observation for other suspected diseases and conditions ruled out: Secondary | ICD-10-CM | POA: Diagnosis not present

## 2012-05-17 LAB — GLUCOSE, CAPILLARY: Glucose-Capillary: 107 mg/dL — ABNORMAL HIGH (ref 70–99)

## 2012-05-17 NOTE — ED Provider Notes (Signed)
History    This chart was scribed for Vida Roller, MD by Marlyne Beards, ED Scribe. The patient was seen in room APA15/APA15. Patient's care was started at 11:27 PM.    CSN: [id number]  Arrival date & time 05/17/12  2327   First MD Initiated Contact with Patient 05/17/12 2336      Chief Complaint  Patient presents with  . Altered Mental Status    (Consider location/radiation/quality/duration/timing/severity/associated sxs/prior treatment) HPI Comments: Level 5 caveat applies secondary to altered MS  Patient is a 67 y.o. female presenting with altered mental status. The history is provided by the patient, the spouse, the EMS personnel and medical records. No language interpreter was used.  Altered Mental Status   Kristina [name] is a 67 y.o. 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 Pt was at work tonight at [employer] in the [employer] in [place] stating it was a normal day. Pt got home and could not remember anything of driving there. Pt denies HI,SI, and any hallucinations. Pt states she had breakfast and dinner (she thinks but can't remember what she ate). Pt is at baseline other than memory according to spouse but does have evident short term memory loss. Pt is responsive to what her address and husbands name is but cannot remember her home phone number. Pt was also responsive to social questions about her family and life. Pt does not have any associated injuries or fever.Pt denies blurred vision, fever, chills, cough, nausea, vomiting, diarrhea, SOB, weakness, and any other associated symptoms. Pt has medical hx of TIA seven years ago when she had similar memory loss sx adn was told by Dr. Gerilyn Pilgrim it was a small stroke. Pt denies smoking tobacco.  Onset of these sx is unknown as she has no memory of when it started.  PCP: Ouida Sills   Past Medical History  Diagnosis Date  . Hyperlipidemia   . TIA (transient ischemic attack)     Past  Surgical History  Procedure Laterality Date  . Colonoscopy  12/22/2006    SLF:  8 mm sigmoid colon polyp removed via snare cautery/ Otherwise no masses, inflammatory changes, diverticula , no internal hemorrhoids. Path-tubulovillous adenomas  . Tonsillectomy    . Colonoscopy  03/16/2012    Procedure: COLONOSCOPY;  Surgeon: West Bali, MD;  Location: [facility];  Service: Endoscopy;  Laterality: N/A;  10:00-changed to 11:00 Soledad Gerlach to notifiy pt    Family History  Problem Relation Age of Onset  . Lung cancer Father   . Colon cancer Other     ?maternal grandmother may have had colon cancer  . Colon polyps Father     History  Substance Use Topics  . Smoking status: Never Smoker   . Smokeless tobacco: Not on file  . Alcohol Use: No    OB History   Grav Para Term Preterm Abortions TAB SAB Ect Mult Living                  Review of Systems  Unable to perform ROS: Mental status change  Psychiatric/Behavioral: Positive for altered mental status.    Allergies  Review of patient's allergies indicates no known allergies.  Home Medications   Current Outpatient Rx  Name  Route  Sig  Dispense  Refill  . cetirizine (ZYRTEC) 10 MG tablet   Oral   Take 10 mg by mouth daily.         . CRESTOR 10  MG tablet   Oral   Take 10 mg by mouth daily.          . meloxicam (MOBIC) 7.5 MG tablet   Oral   Take 7.5 mg by mouth daily.          . nitrofurantoin, macrocrystal-monohydrate, (MACROBID) 100 MG capsule   Oral   Take 100 mg by mouth 2 (two) times daily.           BP 135/75  Pulse 75  Temp(Src) 98.4 F (36.9 C) (Oral)  Resp 18  Ht 5' 3.5" (1.613 m)  Wt 175 lb (79.379 kg)  BMI 30.51 kg/m2  SpO2 99%  Physical Exam  Nursing note and vitals reviewed. Constitutional: She is oriented to person, place, and time. She appears well-developed and well-nourished. No distress.  HENT:  Head: Normocephalic and atraumatic.  Eyes: EOM are normal.  Neck: Neck supple.  No tracheal deviation present.  Cardiovascular: Normal rate, regular rhythm and normal heart sounds.   No murmur heard. Pulmonary/Chest: Effort normal and breath sounds normal. No respiratory distress.  Abdominal: Bowel sounds are normal.  Musculoskeletal: Normal range of motion.  Neurological: She is alert and oriented to person, place, and time.  Neurologic exam:  Speech clear, pupils equal round reactive to light, extraocular movements intact  Normal peripheral visual fields Cranial nerves III through XII normal including no facial droop Follows commands, moves all extremities x4, normal strength to bilateral upper and lower extremities at all major muscle groups including grip Sensation normal to light touch and pinprick Coordination intact, no limb ataxia, finger-nose-finger normal Rapid alternating movements normal No pronator drift Gait normal Memory loss to short term of the days events  Skin: Skin is warm and dry.  Psychiatric: She has a normal mood and affect. Her behavior is normal.    ED Course  Procedures (including critical care time) DIAGNOSTIC STUDIES: Oxygen Saturation is 96% on room air, adequate by my interpretation.    COORDINATION OF CARE: 11:43 PM Discussed ED treatment with pt and pt agrees.     Labs Reviewed  COMPREHENSIVE METABOLIC PANEL - Abnormal; Notable for the following:    Glucose, Bld 123 (*)    GFR calc non Af Amer 90 (*)    All other components within normal limits  GLUCOSE, CAPILLARY - Abnormal; Notable for the following:    Glucose-Capillary 107 (*)    All other components within normal limits  POCT I-STAT, CHEM 8 - Abnormal; Notable for the following:    BUN 24 (*)    Glucose, Bld 122 (*)    All other components within normal limits  PROTIME-INR  APTT  CBC  DIFFERENTIAL  TROPONIN I  POCT I-STAT TROPONIN I   Ct Head Wo Contrast  05/18/2012  *RADIOLOGY REPORT*  Clinical Data: Acute memory loss tonight.  History of previous mini  stroke affecting memory into 1005.  CT HEAD WITHOUT CONTRAST  Technique:  Contiguous axial images were obtained from the base of the skull through the vertex without contrast.  Comparison: MRI brain 06/21/2003.  CT head 06/19/2003.  Findings: Ventricles and sulci are normal for age.  There is some patchy low attenuation change in the deep white matter suggesting small vessel ischemia.  Suggestion of vague low attenuation change in the insular cortical regions bilaterally which appears stable since previous study.  There is no mass effect or midline shift. No abnormal extra-axial fluid collections.  Gray-white matter junctions are distinct.  Basal cisterns are not effaced.  No evidence of acute intracranial hemorrhage.  No depressed skull fractures.  Visualized paranasal sinuses and mastoid air cells are not opacified.  No significant change since previous study.  IMPRESSION: No acute intracranial abnormality identified.  Patchy white matter disease suggesting chronic small vessel ischemia.   Original Report Authenticated By: Burman Nieves, M.D.      1. Memory loss       MDM  Overall the pt appears to be in no distress though she is mildly anxious re: her memory loss.  She has told me several times in the interview that she doesn't remember driving home but doesn't remember telling me that.  All other neuro exam is normal as is PE - has mild htn.  Stroke w/u, metabolic as well.  ED ECG REPORT  I personally interpreted this EKG   Date: 05/18/2012   Rate: 82  Rhythm: normal sinus rhythm  QRS Axis: normal  Intervals: normal  ST/T Wave abnormalities: normal  Conduction Disutrbances:none  Narrative Interpretation:   Old EKG Reviewed: none available  The patient has not had return of her memory prior to admission to the hospital this time. Her CT scan does not show any acute abnormalities in her lab work is also normal. I have discussed the case with Dr. Conley Rolls who will admit the patient to the  hospital.  I personally performed the services described in this documentation, which was scribed in my presence. The recorded information has been reviewed and is accurate.            Vida Roller, MD 05/18/12 Earle Gell

## 2012-05-17 NOTE — ED Notes (Signed)
Patient from home. Husband called ambulance. Patient reports remembers working today states that she does not remember how she got home. Able to tell me name and date of birth. Denies any further complaints at this time.

## 2012-05-18 ENCOUNTER — Observation Stay (HOSPITAL_COMMUNITY): Payer: Medicare Other

## 2012-05-18 DIAGNOSIS — R413 Other amnesia: Secondary | ICD-10-CM | POA: Diagnosis not present

## 2012-05-18 DIAGNOSIS — G459 Transient cerebral ischemic attack, unspecified: Secondary | ICD-10-CM | POA: Diagnosis present

## 2012-05-18 DIAGNOSIS — R51 Headache: Secondary | ICD-10-CM | POA: Diagnosis not present

## 2012-05-18 DIAGNOSIS — I369 Nonrheumatic tricuspid valve disorder, unspecified: Secondary | ICD-10-CM

## 2012-05-18 DIAGNOSIS — E785 Hyperlipidemia, unspecified: Secondary | ICD-10-CM | POA: Diagnosis present

## 2012-05-18 DIAGNOSIS — R4182 Altered mental status, unspecified: Secondary | ICD-10-CM | POA: Diagnosis not present

## 2012-05-18 DIAGNOSIS — R569 Unspecified convulsions: Secondary | ICD-10-CM | POA: Diagnosis not present

## 2012-05-18 LAB — DIFFERENTIAL
Basophils Absolute: 0 10*3/uL (ref 0.0–0.1)
Eosinophils Absolute: 0.1 10*3/uL (ref 0.0–0.7)
Eosinophils Relative: 1 % (ref 0–5)
Monocytes Absolute: 0.5 10*3/uL (ref 0.1–1.0)

## 2012-05-18 LAB — RAPID URINE DRUG SCREEN, HOSP PERFORMED
Amphetamines: NOT DETECTED
Benzodiazepines: NOT DETECTED
Cocaine: NOT DETECTED
Opiates: NOT DETECTED

## 2012-05-18 LAB — LIPID PANEL
HDL: 49 mg/dL (ref >39)
LDL Cholesterol: 162 mg/dL — ABNORMAL HIGH (ref 0–99)
Total CHOL/HDL Ratio: 4.9 RATIO
Triglycerides: 135 mg/dL (ref <150)
VLDL: 27 mg/dL (ref 0–40)

## 2012-05-18 LAB — POCT I-STAT, CHEM 8
Creatinine, Ser: 0.8 mg/dL (ref 0.50–1.10)
Hemoglobin: 14.6 g/dL (ref 12.0–15.0)
Potassium: 4.4 mEq/L (ref 3.5–5.1)
Sodium: 139 mEq/L (ref 135–145)
TCO2: 26 mmol/L (ref 0–100)

## 2012-05-18 LAB — COMPREHENSIVE METABOLIC PANEL
ALT: 16 U/L (ref 0–35)
AST: 18 U/L (ref 0–37)
CO2: 26 mEq/L (ref 19–32)
Calcium: 9.4 mg/dL (ref 8.4–10.5)
Creatinine, Ser: 0.68 mg/dL (ref 0.50–1.10)
GFR calc Af Amer: >90 mL/min (ref >90)
GFR calc non Af Amer: 90 mL/min — ABNORMAL LOW (ref >90)
Glucose, Bld: 123 mg/dL — ABNORMAL HIGH (ref 70–99)
Sodium: 138 mEq/L (ref 135–145)
Total Protein: 7.3 g/dL (ref 6.0–8.3)

## 2012-05-18 LAB — CBC
HCT: 40.3 % (ref 36.0–46.0)
HCT: 41.6 % (ref 36.0–46.0)
MCH: 31.9 pg (ref 26.0–34.0)
MCHC: 34.5 g/dL (ref 30.0–36.0)
MCV: 91.4 fL (ref 78.0–100.0)
MCV: 91.4 fL (ref 78.0–100.0)
Platelets: 278 10*3/uL (ref 150–400)
RDW: 13.1 % (ref 11.5–15.5)
RDW: 13.1 % (ref 11.5–15.5)

## 2012-05-18 LAB — POCT I-STAT TROPONIN I: Troponin i, poc: 0 ng/mL (ref 0.00–0.08)

## 2012-05-18 LAB — GLUCOSE, CAPILLARY: Glucose-Capillary: 97 mg/dL (ref 70–99)

## 2012-05-18 LAB — HEMOGLOBIN A1C: Hgb A1c MFr Bld: 6 % — ABNORMAL HIGH (ref <5.7)

## 2012-05-18 LAB — PROTIME-INR: Prothrombin Time: 11.8 seconds (ref 11.6–15.2)

## 2012-05-18 MED ORDER — ACETAMINOPHEN 325 MG PO TABS
650.0000 mg | ORAL_TABLET | ORAL | Status: DC | PRN
  Administered 2012-05-18 – 2012-05-19 (×6): 650 mg via ORAL
  Filled 2012-05-18 (×6): qty 2

## 2012-05-18 MED ORDER — ATORVASTATIN CALCIUM 20 MG PO TABS
20.0000 mg | ORAL_TABLET | Freq: Every day | ORAL | Status: DC
  Administered 2012-05-18 – 2012-05-19 (×2): 20 mg via ORAL
  Filled 2012-05-18 (×2): qty 1

## 2012-05-18 MED ORDER — ASPIRIN 325 MG PO TABS
325.0000 mg | ORAL_TABLET | Freq: Every day | ORAL | Status: DC
  Administered 2012-05-18 – 2012-05-20 (×3): 325 mg via ORAL
  Filled 2012-05-18 (×3): qty 1

## 2012-05-18 MED ORDER — MELOXICAM 7.5 MG PO TABS
7.5000 mg | ORAL_TABLET | Freq: Every day | ORAL | Status: DC
Start: 2012-05-18 — End: 2012-05-20
  Administered 2012-05-18 – 2012-05-20 (×3): 7.5 mg via ORAL
  Filled 2012-05-18 (×4): qty 1

## 2012-05-18 MED ORDER — LORATADINE 10 MG PO TABS
10.0000 mg | ORAL_TABLET | Freq: Every day | ORAL | Status: DC
  Administered 2012-05-18 – 2012-05-20 (×3): 10 mg via ORAL
  Filled 2012-05-18 (×3): qty 1

## 2012-05-18 MED ORDER — ENOXAPARIN SODIUM 40 MG/0.4ML ~~LOC~~ SOLN
40.0000 mg | SUBCUTANEOUS | Status: DC
  Administered 2012-05-18 – 2012-05-20 (×3): 40 mg via SUBCUTANEOUS
  Filled 2012-05-18 (×3): qty 0.4

## 2012-05-18 MED ORDER — NITROFURANTOIN MONOHYD MACRO 100 MG PO CAPS
100.0000 mg | ORAL_CAPSULE | Freq: Two times a day (BID) | ORAL | Status: DC
  Filled 2012-05-18 (×3): qty 1

## 2012-05-18 NOTE — Progress Notes (Signed)
NAMEMARIKAY, ROADS NO.:  [id number]  MEDICAL RECORD NO.:  [id number]  LOCATION:  A332                          FACILITY:  [facility]  PHYSICIAN:  Kingsley Callander. Ouida Sills, MD       DATE OF BIRTH:  Mar 26, 1945  DATE OF PROCEDURE:  05/18/2012 DATE OF DISCHARGE:                                PROGRESS NOTE   SUBJECTIVE:  Mrs. Schrodt was admitted by the hospitalist last night after she presented with change in mental status.  She was unable to remember events of the day.  She was not able to keep her directions straight.  She underwent a CT scan last night, which revealed small vessel changes only.  She had a similar episode in 2005 and was evaluated by Neurology at the time.  This was felt to have possibly been a TIA.  This morning, she is alert and oriented and complaining of backache and headache.  OBJECTIVE:  VITAL SIGNS:  Normal. General:  Speech is normal. HEENT:  Face is symmetric.  Extraocular movements are intact.  The tongue is midline.  No carotid bruits. LUNGS:  Clear. HEART:  Regular with no murmurs.  Her EKG reveals sinus rhythm. ABDOMEN:  Nontender with no hepatosplenomegaly. EXTREMITIES:  Reveal no clubbing or edema.  She has no weakness in the arms, legs, or face.  She is fully oriented.  IMPRESSION/PLAN: 1. Transient ischemic attack.  Treated with aspirin.  Consult     neurology.  We will obtain an MRI of the brain.  An echo and     carotid ultrasound and EEG have also been ordered. 2. Hyperlipidemia.  She has been on Crestor, but has a cholesterol of     238 with an LDL of 162. 3. Mild hyperglycemia.  She has had glucoses of 123 and 122, and this     morning is 101.     Kingsley Callander. Ouida Sills, MD     ROF/MEDQ  D:  05/18/2012  T:  05/18/2012  Job:  [id number]

## 2012-05-18 NOTE — H&P (Signed)
[facility] History and Physical  ANNALISE MCDIARMID ZHY:[id number] DOB: 1946-02-15    PCP:   Carylon Perches, MD   Chief Complaint: Transcient memory loss.  HPI: Kristina [name] is an 67 y.o. female with hx of HTN, Hyperlipidemia, hx of prior similar event in 2005, presents to the ER with transcient memory loss and a HA.  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 Her husband stated there were no slurred speech, facial droop, or any focal weakness.  She was noted to be asking the same questions over and and over.  She denied any visual problems or any paresthesia.  In 2005, she had very similar episodes and Vytorin was discontinued at that time, as rare cases were reported to cause memory loss.  She was placed back on crestor for many years without any problem until tonight.  She also had a tiny focus of ?infarct on her head MRI in 2005.  She was seen in consultation at that time with Dr Harrel Carina.  In the ER, she is having much better recollection now.  Hospitalist was asked to admit her for TIA.  Rewiew of Systems:  Constitutional: Negative for malaise, fever and chills. No significant weight loss or weight gain Eyes: Negative for eye pain, redness and discharge, diplopia, visual changes, or flashes of light. ENMT: Negative for ear pain, hoarseness, nasal congestion, sinus pressure and sore throat. No headaches; tinnitus, drooling, or problem swallowing. Cardiovascular: Negative for chest pain, palpitations, diaphoresis, dyspnea and peripheral edema. ; No orthopnea, PND Respiratory: Negative for cough, hemoptysis, wheezing and stridor. No pleuritic chestpain. Gastrointestinal: Negative for nausea, vomiting, diarrhea, constipation, abdominal pain, melena, blood in stool, hematemesis, jaundice and rectal bleeding.    Genitourinary: Negative for frequency, dysuria, incontinence,flank pain and  hematuria; Musculoskeletal: Negative for back pain and neck pain. Negative for swelling and trauma.;  Skin: . Negative for pruritus, rash, abrasions, bruising and skin lesion.; ulcerations Neuro: Negative for headache, lightheadedness and neck stiffness. Negative for weakness, altered level of consciousness , altered mental status, extremity weakness, burning feet, involuntary movement, seizure and syncope.  Psych: negative for anxiety, depression, insomnia, tearfulness, panic attacks, hallucinations, paranoia, suicidal or homicidal ideation    Past Medical History  Diagnosis Date  . Hyperlipidemia   . TIA (transient ischemic attack)     Past Surgical History  Procedure Laterality Date  . Colonoscopy  12/22/2006    SLF:  8 mm sigmoid colon polyp removed via snare cautery/ Otherwise no masses, inflammatory changes, diverticula , no internal hemorrhoids. Path-tubulovillous adenomas  . Tonsillectomy    . Colonoscopy  03/16/2012    Procedure: COLONOSCOPY;  Surgeon: West Bali, MD;  Location: [facility];  Service: Endoscopy;  Laterality: N/A;  10:00-changed to 11:00 Soledad Gerlach to notifiy pt    Medications:  HOME MEDS: Prior to Admission medications   Medication Sig Start Date End Date Taking? Authorizing Provider  cetirizine (ZYRTEC) 10 MG tablet Take 10 mg by mouth daily.    Historical Provider, MD  CRESTOR 10 MG tablet Take 10 mg by mouth daily.  12/17/11   Historical Provider, MD  meloxicam (MOBIC) 7.5 MG tablet Take 7.5 mg by mouth daily.  02/09/12   Historical Provider, MD  nitrofurantoin, macrocrystal-monohydrate, (MACROBID) 100 MG capsule Take 100 mg by mouth 2 (two) times daily.    Historical Provider, MD     Allergies:  No Known Allergies  Social History:  reports that she has never smoked. She does not have any smokeless tobacco history on file. She reports that she does not drink alcohol or use illicit drugs.  Family History: Family History  Problem Relation Age  of Onset  . Lung cancer Father   . Colon cancer Other     ?maternal grandmother may have had colon cancer  . Colon polyps Father      Physical Exam: Filed Vitals:   05/18/12 0158 05/18/12 0159 05/18/12 0200 05/18/12 0300  BP: 132/77 135/75 136/68 135/62  Pulse:  75 75 76  Temp:      TempSrc:      Resp:  18 12 15   Height:      Weight:      SpO2: 99% 99% 99% 98%   Blood pressure 135/62, pulse 76, temperature 98.4 F (36.9 C), temperature source Oral, resp. rate 15, height 5' 3.5" (1.613 m), weight 79.379 kg (175 lb), SpO2 98.00%.  GEN:  Pleasant  patient lying in the stretcher in no acute distress; cooperative with exam. PSYCH:  alert and oriented x4; does not appear anxious or depressed; affect is appropriate. HEENT: Mucous membranes pink and anicteric; PERRLA; EOM intact; no cervical lymphadenopathy nor thyromegaly or carotid bruit; no JVD; There were no stridor. Neck is very supple. Breasts:: Not examined CHEST WALL: No tenderness CHEST: Normal respiration, clear to auscultation bilaterally.  HEART: Regular rate and rhythm.  There are no murmur, rub, or gallops.   BACK: No kyphosis or scoliosis; no CVA tenderness ABDOMEN: soft and non-tender; no masses, no organomegaly, normal abdominal bowel sounds; no pannus; no intertriginous candida. There is no rebound and no distention. Rectal Exam: Not done EXTREMITIES: No bone or joint deformity; age-appropriate arthropathy of the hands and knees; no edema; no ulcerations.  There is no calf tenderness. Genitalia: not examined PULSES: 2+ and symmetric SKIN: Normal hydration no rash or ulceration CNS: Cranial nerves 2-12 grossly intact no focal lateralizing neurologic deficit.  Speech is fluent; uvula elevated with phonation, facial symmetry and tongue midline. DTR are normal bilaterally, cerebella exam is intact, barbinski is negative and strengths are equaled bilaterally.  No sensory loss.   Labs on Admission:  Basic Metabolic  Panel:  Recent Labs Lab 05/17/12 2355 05/18/12 0020  NA 138 139  K 4.0 4.4  CL 100 107  CO2 26  --   GLUCOSE 123* 122*  BUN 22 24*  CREATININE 0.68 0.80  CALCIUM 9.4  --    Liver Function Tests:  Recent Labs Lab 05/17/12 2355  AST 18  ALT 16  ALKPHOS 103  BILITOT 0.3  PROT 7.3  ALBUMIN 4.0   No results found for this basename: LIPASE, AMYLASE,  in the last 168 hours No results found for this basename: AMMONIA,  in the last 168 hours CBC:  Recent Labs Lab 05/17/12 2355 05/18/12 0020  WBC 8.6  --   NEUTROABS 6.5  --   HGB 14.5 14.6  HCT 41.6 43.0  MCV 91.4  --   PLT 278  --    Cardiac Enzymes:  Recent Labs Lab 05/17/12 2355  TROPONINI <0.30    CBG:  Recent Labs Lab 05/17/12 2351  GLUCAP 107*     Radiological Exams on Admission: Ct Head Wo Contrast  05/18/2012  *RADIOLOGY REPORT*  Clinical Data: Acute memory loss tonight.  History of previous mini stroke affecting memory into 1005.  CT HEAD WITHOUT CONTRAST  Technique:  Contiguous axial images were obtained from the base of  the skull through the vertex without contrast.  Comparison: MRI brain 06/21/2003.  CT head 06/19/2003.  Findings: Ventricles and sulci are normal for age.  There is some patchy low attenuation change in the deep white matter suggesting small vessel ischemia.  Suggestion of vague low attenuation change in the insular cortical regions bilaterally which appears stable since previous study.  There is no mass effect or midline shift. No abnormal extra-axial fluid collections.  Gray-white matter junctions are distinct.  Basal cisterns are not effaced.  No evidence of acute intracranial hemorrhage.  No depressed skull fractures.  Visualized paranasal sinuses and mastoid air cells are not opacified.  No significant change since previous study.  IMPRESSION: No acute intracranial abnormality identified.  Patchy white matter disease suggesting chronic small vessel ischemia.   Original Report  Authenticated By: Burman Nieves, M.D.     EKG: Independently reviewed. NSR without any acute ST T changes.   Assessment/Plan Present on Admission:  . Brain TIA . Amnesia memory loss . Hyperlipidemia Complex Migraine.  PLAN:  Will admit her for further work up.  The DDX will include TIA, CVA, complex migraine, medication induced memory loss, seizure, and psychogenic causes.  Of these, I began to think that complex migraine is higher up in the DDx along with TIA.  I noted that in 2005, she also had a headache with her memory loss.  Even if statin is the cause of her transcient memory loss, it had been over 9 years since she had an episode, and the benefit of continuing her statin may outweight her risk.  I have sent another fasting lipid also.  I will admit her for a full work up to include MRI, MRA brain, Echo, and coratid doppler and an EEG.   I have continued her home meds, including the statin and have explained her the rationals.  Please consult Dr Harrel Carina for further recommendation.  She is getting better, is stable, and will be admitted to telemetry under Dr Alonza Smoker service.  I will notify him in the morning of her admission.  Thank you so much for allowing me to partake in the care of your nice patient.  Other plans as per orders.  Code Status: FULL Unk Lightning, MD. [facility] Pager [phone] 7pm to 7am.  05/18/2012, 3:25 AM

## 2012-05-18 NOTE — Consult Note (Signed)
[facility] Kristina Klassen A. Gerilyn Pilgrim, MD     [email or web address]          Kristina [name] is an 67 y.o. female.   ASSESSMENT/PLAN:  1. Episode of altered mental status and amnesia of unclear etiology. The differential diagnosis includes Compass partial seizures/nonconvulsive seizures. Cardiac events are possibility. Primary cerebrovascular ischemic event seems unlikely. Psychosomatic disorder such as reaction to psychosocial stresses are also possibility. So far the workup has been unrevealing. She is in the evaluated with an EEG. This would be resident followed. She also is on telemetry. She has a carotid ordered which has been done. Echo apparently has been done. We will follow up the results of this.   The patient presents with the acute onset of altered mental status probably while driving. The patient was driving away from [place] to her house. This takes about 45 minutes. The event lasted for about the time that she was driving. She was anxious about driving home in the sleep and ice. Her family noted when she got home she was shaky and nervous. She had no recollection about driving home. She did drive home by herself. The patient does not recall any of this and the came to her senses in the emergency room. Again, the family noted that she was confused and disoriented and amnestic. The patient complains of a headache after she got to the emergency room. She has a headache today. Headache is in the bifrontal area. The patient had a similar event about 9 years ago. She was evaluated extensively at this hospital. Fact, I did see her during that evaluation. The event is essentially similar. At that point in time, she was started on a new cholesterol medication and the dose had just been increased. This was Vytorin. After the increased dose, she developed the spell. The workup at that time was essentially unremarkable other than for a tiny increased signal seen on diffusion imaging involving  the mesial temporal lobe suggestive of a lacunar infarct. The patient has had no recurrent spells until a couple days ago. The patient does not report any other associated symptoms such as shortness of breath, chest pain, focal numbness or weakness GU or GI symptoms.  GENERAL: This is a pleasant overweight lady in no acute distress.  HEENT: Supple. Atraumatic normocephalic.   ABDOMEN: soft  EXTREMITIES: No edema   BACK: Normal.  SKIN: Normal by inspection.    MENTAL STATUS: Alert and oriented. Speech, language and cognition are generally intact. Judgment and insight normal.   CRANIAL NERVES: Pupils are equal, round and reactive to light and accomodation; extra ocular movements are full, there is no significant nystagmus; visual fields are full; upper and lower facial muscles are normal in strength and symmetric, there is no flattening of the nasolabial folds; tongue is midline; uvula is midline; shoulder elevation is normal.  MOTOR: Normal tone, bulk and strength; no pronator drift.  COORDINATION: Left finger to nose is normal, right finger to nose is normal, No rest tremor; no intention tremor; no postural tremor; no bradykinesia.  REFLEXES: Deep tendon reflexes are symmetrical and normal. Babinski reflexes are flexor bilaterally.   SENSATION: Normal to light touch.  GAIT: Normal.  Past Medical History  Diagnosis Date  . Hyperlipidemia   . TIA (transient ischemic attack)     Past Surgical History  Procedure Laterality Date  . Colonoscopy  12/22/2006    SLF:  8 mm sigmoid colon polyp removed via snare cautery/ Otherwise no masses, inflammatory changes, diverticula ,  no internal hemorrhoids. Path-tubulovillous adenomas  . Tonsillectomy    . Colonoscopy  03/16/2012    Procedure: COLONOSCOPY;  Surgeon: West Bali, MD;  Location: [facility];  Service: Endoscopy;  Laterality: N/A;  10:00-changed to 11:00 Soledad Gerlach to notifiy pt    Family History  Problem Relation Age of  Onset  . Lung cancer Father   . Colon cancer Other     ?maternal grandmother may have had colon cancer  . Colon polyps Father     Social History:  reports that she has never smoked. She does not have any smokeless tobacco history on file. She reports that she does not drink alcohol or use illicit drugs.  Allergies:  Allergies  Allergen Reactions  . Lactose Intolerance (Gi) Other (See Comments)    Upset Stomach     Medications: Prior to Admission medications   Medication Sig Start Date End Date Taking? Authorizing Provider  cetirizine (ZYRTEC) 10 MG tablet Take 10 mg by mouth daily.   Yes Historical Provider, MD  CRESTOR 10 MG tablet Take 10 mg by mouth daily.  12/17/11  Yes Historical Provider, MD  Estradiol (VAGIFEM) 10 MCG TABS Place 10 mcg vaginally once a week.   Yes Historical Provider, MD  meloxicam (MOBIC) 7.5 MG tablet Take 7.5 mg by mouth daily.  02/09/12  Yes Historical Provider, MD  sodium chloride (OCEAN) 0.65 % nasal spray Place into the nose as needed for congestion.   Yes Historical Provider, MD    Scheduled Meds: . aspirin  325 mg Oral Daily  . atorvastatin  20 mg Oral q1800  . enoxaparin (LOVENOX) injection  40 mg Subcutaneous Q24H  . loratadine  10 mg Oral Daily  . meloxicam  7.5 mg Oral Daily   Continuous Infusions:  PRN Meds:.acetaminophen   Blood pressure 123/56, pulse 65, temperature 98.1 F (36.7 C), temperature source Oral, resp. rate 20, height 5' 3.5" (1.613 m), weight 80.5 kg (177 lb 7.5 oz), SpO2 95.00%.   Results for orders placed during the hospital encounter of 05/17/12 (from the past 48 hour(s))  GLUCOSE, CAPILLARY     Status: Abnormal   Collection Time    05/17/12 11:51 PM      Result Value Range   Glucose-Capillary 107 (*) 70 - 99 mg/dL  PROTIME-INR     Status: None   Collection Time    05/17/12 11:55 PM      Result Value Range   Prothrombin Time 11.8  11.6 - 15.2 seconds   INR 0.87  0.00 - 1.49  APTT     Status: None   Collection  Time    05/17/12 11:55 PM      Result Value Range   aPTT 27  24 - 37 seconds  CBC     Status: None   Collection Time    05/17/12 11:55 PM      Result Value Range   WBC 8.6  4.0 - 10.5 K/uL   RBC 4.55  3.87 - 5.11 MIL/uL   Hemoglobin 14.5  12.0 - 15.0 g/dL   HCT 16.1  09.6 - 04.5 %   MCV 91.4  78.0 - 100.0 fL   MCH 31.9  26.0 - 34.0 pg   MCHC 34.9  30.0 - 36.0 g/dL   RDW 40.9  81.1 - 91.4 %   Platelets 278  150 - 400 K/uL  DIFFERENTIAL     Status: None   Collection Time    05/17/12 11:55 PM  Result Value Range   Neutrophils Relative 76  43 - 77 %   Neutro Abs 6.5  1.7 - 7.7 K/uL   Lymphocytes Relative 17  12 - 46 %   Lymphs Abs 1.5  0.7 - 4.0 K/uL   Monocytes Relative 6  3 - 12 %   Monocytes Absolute 0.5  0.1 - 1.0 K/uL   Eosinophils Relative 1  0 - 5 %   Eosinophils Absolute 0.1  0.0 - 0.7 K/uL   Basophils Relative 0  0 - 1 %   Basophils Absolute 0.0  0.0 - 0.1 K/uL  COMPREHENSIVE METABOLIC PANEL     Status: Abnormal   Collection Time    05/17/12 11:55 PM      Result Value Range   Sodium 138  135 - 145 mEq/L   Potassium 4.0  3.5 - 5.1 mEq/L   Chloride 100  96 - 112 mEq/L   CO2 26  19 - 32 mEq/L   Glucose, Bld 123 (*) 70 - 99 mg/dL   BUN 22  6 - 23 mg/dL   Creatinine, Ser 4.09  0.50 - 1.10 mg/dL   Calcium 9.4  8.4 - 81.1 mg/dL   Total Protein 7.3  6.0 - 8.3 g/dL   Albumin 4.0  3.5 - 5.2 g/dL   AST 18  0 - 37 U/L   ALT 16  0 - 35 U/L   Alkaline Phosphatase 103  39 - 117 U/L   Total Bilirubin 0.3  0.3 - 1.2 mg/dL   GFR calc non Af Amer 90 (*) >90 mL/min   GFR calc Af Amer >90  >90 mL/min   Comment:            The eGFR has been calculated     using the CKD EPI equation.     This calculation has not been     validated in all clinical     situations.     eGFR's persistently     <90 mL/min signify     possible Chronic Kidney Disease.  TROPONIN I     Status: None   Collection Time    05/17/12 11:55 PM      Result Value Range   Troponin I <0.30  <0.30 ng/mL     Comment:            Due to the release kinetics of cTnI,     a negative result within the first hours     of the onset of symptoms does not rule out     myocardial infarction with certainty.     If myocardial infarction is still suspected,     repeat the test at appropriate intervals.  POCT I-STAT, CHEM 8     Status: Abnormal   Collection Time    05/18/12 12:20 AM      Result Value Range   Sodium 139  135 - 145 mEq/L   Potassium 4.4  3.5 - 5.1 mEq/L   Chloride 107  96 - 112 mEq/L   BUN 24 (*) 6 - 23 mg/dL   Creatinine, Ser 9.14  0.50 - 1.10 mg/dL   Glucose, Bld 782 (*) 70 - 99 mg/dL   Calcium, Ion 9.56  2.13 - 1.30 mmol/L   TCO2 26  0 - 100 mmol/L   Hemoglobin 14.6  12.0 - 15.0 g/dL   HCT 08.6  57.8 - 46.9 %  POCT I-STAT TROPONIN I     Status: None   Collection  Time    05/18/12 12:27 AM      Result Value Range   Troponin i, poc 0.00  0.00 - 0.08 ng/mL   Comment 3            Comment: Due to the release kinetics of cTnI,     a negative result within the first hours     of the onset of symptoms does not rule out     myocardial infarction with certainty.     If myocardial infarction is still suspected,     repeat the test at appropriate intervals.  HEMOGLOBIN A1C     Status: Abnormal   Collection Time    05/18/12  5:59 AM      Result Value Range   Hemoglobin A1C 6.0 (*) <5.7 %   Comment: (NOTE)                                                                               According to the ADA Clinical Practice Recommendations for 2011, when     HbA1c is used as a screening test:      >=6.5%   Diagnostic of Diabetes Mellitus               (if abnormal result is confirmed)     5.7-6.4%   Increased risk of developing Diabetes Mellitus     References:Diagnosis and Classification of Diabetes Mellitus,Diabetes     Care,2011,34(Suppl 1):S62-S69 and Standards of Medical Care in             Diabetes - 2011,Diabetes Care,2011,34 (Suppl 1):S11-S61.   Mean Plasma Glucose 126 (*)  <117 mg/dL  CBC     Status: None   Collection Time    05/18/12  5:59 AM      Result Value Range   WBC 8.1  4.0 - 10.5 K/uL   RBC 4.41  3.87 - 5.11 MIL/uL   Hemoglobin 13.9  12.0 - 15.0 g/dL   HCT 21.3  08.6 - 57.8 %   MCV 91.4  78.0 - 100.0 fL   MCH 31.5  26.0 - 34.0 pg   MCHC 34.5  30.0 - 36.0 g/dL   RDW 46.9  62.9 - 52.8 %   Platelets 283  150 - 400 K/uL  CREATININE, SERUM     Status: Abnormal   Collection Time    05/18/12  5:59 AM      Result Value Range   Creatinine, Ser 0.69  0.50 - 1.10 mg/dL   GFR calc non Af Amer 89 (*) >90 mL/min   GFR calc Af Amer >90  >90 mL/min   Comment:            The eGFR has been calculated     using the CKD EPI equation.     This calculation has not been     validated in all clinical     situations.     eGFR's persistently     <90 mL/min signify     possible Chronic Kidney Disease.  LIPID PANEL     Status: Abnormal   Collection Time    05/18/12  6:02 AM      Result Value Range  Cholesterol 238 (*) 0 - 200 mg/dL   Triglycerides 161  <096 mg/dL   HDL 49  >04 mg/dL   Total CHOL/HDL Ratio 4.9     VLDL 27  0 - 40 mg/dL   LDL Cholesterol 540 (*) 0 - 99 mg/dL   Comment:            Total Cholesterol/HDL:CHD Risk     Coronary Heart Disease Risk Table                         Men   Women      1/2 Average Risk   3.4   3.3      Average Risk       5.0   4.4      2 X Average Risk   9.6   7.1      3 X Average Risk  23.4   11.0                Use the calculated Patient Ratio     above and the CHD Risk Table     to determine the patient's CHD Risk.                ATP III CLASSIFICATION (LDL):      <100     mg/dL   Optimal      981-191  mg/dL   Near or Above                        Optimal      130-159  mg/dL   Borderline      478-295  mg/dL   High      >621     mg/dL   Very High  GLUCOSE, CAPILLARY     Status: Abnormal   Collection Time    05/18/12  7:56 AM      Result Value Range   Glucose-Capillary 101 (*) 70 - 99 mg/dL   Comment 1  Documented in Chart     Comment 2 Notify RN    URINE RAPID DRUG SCREEN (HOSP PERFORMED)     Status: None   Collection Time    05/18/12  8:52 AM      Result Value Range   Opiates NONE DETECTED  NONE DETECTED   Cocaine NONE DETECTED  NONE DETECTED   Benzodiazepines NONE DETECTED  NONE DETECTED   Amphetamines NONE DETECTED  NONE DETECTED   Tetrahydrocannabinol NONE DETECTED  NONE DETECTED   Barbiturates NONE DETECTED  NONE DETECTED   Comment:            DRUG SCREEN FOR MEDICAL PURPOSES     ONLY.  IF CONFIRMATION IS NEEDED     FOR ANY PURPOSE, NOTIFY LAB     WITHIN 5 DAYS.                LOWEST DETECTABLE LIMITS     FOR URINE DRUG SCREEN     Drug Class       Cutoff (ng/mL)     Amphetamine      1000     Barbiturate      200     Benzodiazepine   200     Tricyclics       300     Opiates          300     Cocaine  300     THC              50  GLUCOSE, CAPILLARY     Status: None   Collection Time    05/18/12 11:40 AM      Result Value Range   Glucose-Capillary 95  70 - 99 mg/dL   Comment 1 Documented in Chart     Comment 2 Notify RN    GLUCOSE, CAPILLARY     Status: Abnormal   Collection Time    05/18/12  4:25 PM      Result Value Range   Glucose-Capillary 172 (*) 70 - 99 mg/dL   Comment 1 Notify RN     Comment 2 Documented in Chart      Ct Head Wo Contrast  05/18/2012  *RADIOLOGY REPORT*  Clinical Data: Acute memory loss tonight.  History of previous mini stroke affecting memory into 1005.  CT HEAD WITHOUT CONTRAST  Technique:  Contiguous axial images were obtained from the base of the skull through the vertex without contrast.  Comparison: MRI brain 06/21/2003.  CT head 06/19/2003.  Findings: Ventricles and sulci are normal for age.  There is some patchy low attenuation change in the deep white matter suggesting small vessel ischemia.  Suggestion of vague low attenuation change in the insular cortical regions bilaterally which appears stable since previous study.  There  is no mass effect or midline shift. No abnormal extra-axial fluid collections.  Gray-white matter junctions are distinct.  Basal cisterns are not effaced.  No evidence of acute intracranial hemorrhage.  No depressed skull fractures.  Visualized paranasal sinuses and mastoid air cells are not opacified.  No significant change since previous study.  IMPRESSION: No acute intracranial abnormality identified.  Patchy white matter disease suggesting chronic small vessel ischemia.   Original Report Authenticated By: Burman Nieves, M.D.    [facility] Wo Contrast  05/18/2012  *RADIOLOGY REPORT*  Clinical Data:  Acute onset of memory loss. Headaches.  Weakness. Hyperlipidemia.  MRI BRAIN WITHOUT CONTRAST MRA HEAD WITHOUT CONTRAST  Technique: Multiplanar, multiecho pulse sequences of the brain and surrounding structures were obtained according to standard protocol without intravenous contrast.  Angiographic images of the head were obtained using MRA technique without contrast.  Comparison: 05/18/2012 CT.  06/21/2003 MR.  MRI HEAD  Findings:  No acute infarct.  Very mild nonspecific white matter type changes most likely related to result of small vessel disease.  No intracranial hemorrhage.  No intracranial mass lesion detected on this unenhanced exam.  No hydrocephalus.  No significant atrophy.  Partially empty sella unchanged and probably an incidental finding.  IMPRESSION: No acute infarct.  MRA HEAD  Findings: Regions of loss of signal most notable cavernous segment of the internal carotid artery bilaterally have an appearance more suggestive of artifact rather than true stenosis.  Anterior circulation otherwise without medium or large sized vessel significant stenosis or occlusion.  Fetal type origin of the posterior cerebral artery.  Right vertebral artery ends in a PICA distribution.  Areas of loss of signal of the right vertebral artery and PICA may be related to artifact rather than true stenosis.  Mild  irregularity of portions of the left vertebral artery may represent result of artifact versus mild narrowing.  No high-grade stenosis of the basilar artery.  Nonvisualization left AICA.  Poor delineation of the full extent of the superior cerebellar artery bilaterally.  Branch vessel irregularity.  No aneurysm or vascular malformation noted.  IMPRESSION: Artifact suspected as  contributing to areas of loss of signal involving portions of the anterior and posterior circulation as noted above.  This limits evaluation for detection of true stenosis in these regions.   Original Report Authenticated By: Lacy Duverney, M.D.    Mri Brain Without Contrast  05/18/2012  *RADIOLOGY REPORT*  Clinical Data:  Acute onset of memory loss. Headaches.  Weakness. Hyperlipidemia.  MRI BRAIN WITHOUT CONTRAST MRA HEAD WITHOUT CONTRAST  Technique: Multiplanar, multiecho pulse sequences of the brain and surrounding structures were obtained according to standard protocol without intravenous contrast.  Angiographic images of the head were obtained using MRA technique without contrast.  Comparison: 05/18/2012 CT.  06/21/2003 MR.  MRI HEAD  Findings:  No acute infarct.  Very mild nonspecific white matter type changes most likely related to result of small vessel disease.  No intracranial hemorrhage.  No intracranial mass lesion detected on this unenhanced exam.  No hydrocephalus.  No significant atrophy.  Partially empty sella unchanged and probably an incidental finding.  IMPRESSION: No acute infarct.  MRA HEAD  Findings: Regions of loss of signal most notable cavernous segment of the internal carotid artery bilaterally have an appearance more suggestive of artifact rather than true stenosis.  Anterior circulation otherwise without medium or large sized vessel significant stenosis or occlusion.  Fetal type origin of the posterior cerebral artery.  Right vertebral artery ends in a PICA distribution.  Areas of loss of signal of the right  vertebral artery and PICA may be related to artifact rather than true stenosis.  Mild irregularity of portions of the left vertebral artery may represent result of artifact versus mild narrowing.  No high-grade stenosis of the basilar artery.  Nonvisualization left AICA.  Poor delineation of the full extent of the superior cerebellar artery bilaterally.  Branch vessel irregularity.  No aneurysm or vascular malformation noted.  IMPRESSION: Artifact suspected as contributing to areas of loss of signal involving portions of the anterior and posterior circulation as noted above.  This limits evaluation for detection of true stenosis in these regions.   Original Report Authenticated By: Lacy Duverney, M.D.         Corneilus Heggie A. Gerilyn [name], M.D.  Diplomate, Biomedical engineer of Psychiatry and Neurology ( Neurology). 05/18/2012, 7:36 PM

## 2012-05-18 NOTE — Progress Notes (Signed)
UR Chart Review Completed  

## 2012-05-18 NOTE — Care Management Note (Unsigned)
    Page 1 of 1   05/18/2012     3:36:39 PM   CARE MANAGEMENT NOTE 05/18/2012  Patient:  Kristina [name], Kristina [name]   Account Number:  [id number]  Date Initiated:  05/18/2012  Documentation initiated by:  Sharrie Rothman  Subjective/Objective Assessment:   Pt admitted from home with possible TIA. Pt currently lives with her husband and will return home at discharge. Pt is independent with ADL's.     Action/Plan:   No Cm needs noted.   Anticipated DC Date:  05/19/2012   Anticipated DC Plan:  HOME/SELF CARE      DC Planning Services  CM consult      Choice offered to / List presented to:             Status of service:  Completed, signed off Medicare Important Message given?   (If response is "NO", the following Medicare IM given date fields will be blank) Date Medicare IM given:   Date Additional Medicare IM given:    Discharge Disposition:  HOME/SELF CARE  Per UR Regulation:    If discussed at Long Length of Stay Meetings, dates discussed:    Comments:  05/18/12 1540 Arlyss Queen, RN BSN CM

## 2012-05-18 NOTE — Progress Notes (Signed)
*  PRELIMINARY RESULTS* Echocardiogram 2D Echocardiogram has been performed.  Kristina [name] 05/18/2012, 11:14 AM 

## 2012-05-19 ENCOUNTER — Other Ambulatory Visit (HOSPITAL_COMMUNITY): Payer: Medicare Other

## 2012-05-19 LAB — GLUCOSE, CAPILLARY
Glucose-Capillary: 79 mg/dL (ref 70–99)
Glucose-Capillary: 95 mg/dL (ref 70–99)
Glucose-Capillary: 109 mg/dL — ABNORMAL HIGH (ref 70–99)

## 2012-05-19 MED ORDER — STROKE: EARLY STAGES OF RECOVERY BOOK
Freq: Once | Status: AC
  Administered 2012-05-19: 1
  Filled 2012-05-19: qty 1

## 2012-05-19 NOTE — Progress Notes (Signed)
UR Chart Review Completed  

## 2012-05-19 NOTE — Progress Notes (Signed)
Subjective: She was admitted with an approximately two-hour episode of acute memory loss. She had a similar episode about 9 years ago. It was thought this might be a TIA. She says she has headache and backache which also happened after her last episode.  Objective: Vital signs in last 24 hours: Temp:  [97.8 F (36.6 C)-98.2 F (36.8 C)] 97.8 F (36.6 C) (03/19 0500) Pulse Rate:  [57-65] 57 (03/19 0500) Resp:  [16-20] 18 (03/19 0500) BP: (107-147)/(49-79) 107/49 mmHg (03/19 0500) SpO2:  [93 %-98 %] 95 % (03/19 0500) Weight change:  Last BM Date: 05/18/12  Intake/Output from previous day: 03/18 0701 - 03/19 0700 In: 120 [P.O.:120] Out: 250 [Urine:250]  PHYSICAL EXAM General appearance: alert, cooperative and no distress Resp: clear to auscultation bilaterally Cardio: regular rate and rhythm, S1, S2 normal, no murmur, click, rub or gallop GI: soft, non-tender; bowel sounds normal; no masses,  no organomegaly Extremities: extremities normal, atraumatic, no cyanosis or edema  Lab Results:    Basic Metabolic Panel:  Recent Labs  16/10/96 2355 05/18/12 0020 05/18/12 0559  NA 138 139  --   K 4.0 4.4  --   CL 100 107  --   CO2 26  --   --   GLUCOSE 123* 122*  --   BUN 22 24*  --   CREATININE 0.68 0.80 0.69  CALCIUM 9.4  --   --    Liver Function Tests:  Recent Labs  05/17/12 2355  AST 18  ALT 16  ALKPHOS 103  BILITOT 0.3  PROT 7.3  ALBUMIN 4.0   No results found for this basename: LIPASE, AMYLASE,  in the last 72 hours No results found for this basename: AMMONIA,  in the last 72 hours CBC:  Recent Labs  05/17/12 2355 05/18/12 0020 05/18/12 0559  WBC 8.6  --  8.1  NEUTROABS 6.5  --   --   HGB 14.5 14.6 13.9  HCT 41.6 43.0 40.3  MCV 91.4  --  91.4  PLT 278  --  283   Cardiac Enzymes:  Recent Labs  05/17/12 2355  TROPONINI <0.30   BNP: No results found for this basename: PROBNP,  in the last 72 hours D-Dimer: No results found for this  basename: DDIMER,  in the last 72 hours CBG:  Recent Labs  05/17/12 2351 05/18/12 0756 05/18/12 1140 05/18/12 1625 05/18/12 2056 05/19/12 0724  GLUCAP 107* 101* 95 172* 97 79   Hemoglobin A1C:  Recent Labs  05/18/12 0559  HGBA1C 6.0*   Fasting Lipid Panel:  Recent Labs  05/18/12 0602  CHOL 238*  HDL 49  LDLCALC 162*  TRIG 135  CHOLHDL 4.9   Thyroid Function Tests: No results found for this basename: TSH, T4TOTAL, FREET4, T3FREE, THYROIDAB,  in the last 72 hours Anemia Panel: No results found for this basename: VITAMINB12, FOLATE, FERRITIN, TIBC, IRON, RETICCTPCT,  in the last 72 hours Coagulation:  Recent Labs  05/17/12 2355  LABPROT 11.8  INR 0.87   Urine Drug Screen: Drugs of Abuse     Component Value Date/Time   LABOPIA NONE DETECTED 05/18/2012 0852   COCAINSCRNUR NONE DETECTED 05/18/2012 0852   LABBENZ NONE DETECTED 05/18/2012 0852   AMPHETMU NONE DETECTED 05/18/2012 0852   THCU NONE DETECTED 05/18/2012 0852   LABBARB NONE DETECTED 05/18/2012 0852    Alcohol Level: No results found for this basename: ETH,  in the last 72 hours Urinalysis: No results found for this basename: COLORURINE, APPERANCEUR, LABSPEC,  PHURINE, GLUCOSEU, HGBUR, BILIRUBINUR, KETONESUR, PROTEINUR, UROBILINOGEN, NITRITE, LEUKOCYTESUR,  in the last 72 hours Misc. Labs:  ABGS  Recent Labs  05/18/12 0020  TCO2 26   CULTURES No results found for this or any previous visit (from the past 240 hour(s)). Studies/Results: Ct Head Wo Contrast  05/18/2012  *RADIOLOGY REPORT*  Clinical Data: Acute memory loss tonight.  History of previous mini stroke affecting memory into 1005.  CT HEAD WITHOUT CONTRAST  Technique:  Contiguous axial images were obtained from the base of the skull through the vertex without contrast.  Comparison: MRI brain 06/21/2003.  CT head 06/19/2003.  Findings: Ventricles and sulci are normal for age.  There is some patchy low attenuation change in the deep white matter  suggesting small vessel ischemia.  Suggestion of vague low attenuation change in the insular cortical regions bilaterally which appears stable since previous study.  There is no mass effect or midline shift. No abnormal extra-axial fluid collections.  Gray-white matter junctions are distinct.  Basal cisterns are not effaced.  No evidence of acute intracranial hemorrhage.  No depressed skull fractures.  Visualized paranasal sinuses and mastoid air cells are not opacified.  No significant change since previous study.  IMPRESSION: No acute intracranial abnormality identified.  Patchy white matter disease suggesting chronic small vessel ischemia.   Original Report Authenticated By: Burman Nieves, M.D.    Mr [facility] Wo Contrast  05/18/2012  *RADIOLOGY REPORT*  Clinical Data:  Acute onset of memory loss. Headaches.  Weakness. Hyperlipidemia.  MRI BRAIN WITHOUT CONTRAST MRA HEAD WITHOUT CONTRAST  Technique: Multiplanar, multiecho pulse sequences of the brain and surrounding structures were obtained according to standard protocol without intravenous contrast.  Angiographic images of the head were obtained using MRA technique without contrast.  Comparison: 05/18/2012 CT.  06/21/2003 MR.  MRI HEAD  Findings:  No acute infarct.  Very mild nonspecific white matter type changes most likely related to result of small vessel disease.  No intracranial hemorrhage.  No intracranial mass lesion detected on this unenhanced exam.  No hydrocephalus.  No significant atrophy.  Partially empty sella unchanged and probably an incidental finding.  IMPRESSION: No acute infarct.  MRA HEAD  Findings: Regions of loss of signal most notable cavernous segment of the internal carotid artery bilaterally have an appearance more suggestive of artifact rather than true stenosis.  Anterior circulation otherwise without medium or large sized vessel significant stenosis or occlusion.  Fetal type origin of the posterior cerebral artery.  Right  vertebral artery ends in a PICA distribution.  Areas of loss of signal of the right vertebral artery and PICA may be related to artifact rather than true stenosis.  Mild irregularity of portions of the left vertebral artery may represent result of artifact versus mild narrowing.  No high-grade stenosis of the basilar artery.  Nonvisualization left AICA.  Poor delineation of the full extent of the superior cerebellar artery bilaterally.  Branch vessel irregularity.  No aneurysm or vascular malformation noted.  IMPRESSION: Artifact suspected as contributing to areas of loss of signal involving portions of the anterior and posterior circulation as noted above.  This limits evaluation for detection of true stenosis in these regions.   Original Report Authenticated By: Lacy Duverney, M.D.    Mri Brain Without Contrast  05/18/2012  *RADIOLOGY REPORT*  Clinical Data:  Acute onset of memory loss. Headaches.  Weakness. Hyperlipidemia.  MRI BRAIN WITHOUT CONTRAST MRA HEAD WITHOUT CONTRAST  Technique: Multiplanar, multiecho pulse sequences of the brain and surrounding  structures were obtained according to standard protocol without intravenous contrast.  Angiographic images of the head were obtained using MRA technique without contrast.  Comparison: 05/18/2012 CT.  06/21/2003 MR.  MRI HEAD  Findings:  No acute infarct.  Very mild nonspecific white matter type changes most likely related to result of small vessel disease.  No intracranial hemorrhage.  No intracranial mass lesion detected on this unenhanced exam.  No hydrocephalus.  No significant atrophy.  Partially empty sella unchanged and probably an incidental finding.  IMPRESSION: No acute infarct.  MRA HEAD  Findings: Regions of loss of signal most notable cavernous segment of the internal carotid artery bilaterally have an appearance more suggestive of artifact rather than true stenosis.  Anterior circulation otherwise without medium or large sized vessel significant  stenosis or occlusion.  Fetal type origin of the posterior cerebral artery.  Right vertebral artery ends in a PICA distribution.  Areas of loss of signal of the right vertebral artery and PICA may be related to artifact rather than true stenosis.  Mild irregularity of portions of the left vertebral artery may represent result of artifact versus mild narrowing.  No high-grade stenosis of the basilar artery.  Nonvisualization left AICA.  Poor delineation of the full extent of the superior cerebellar artery bilaterally.  Branch vessel irregularity.  No aneurysm or vascular malformation noted.  IMPRESSION: Artifact suspected as contributing to areas of loss of signal involving portions of the anterior and posterior circulation as noted above.  This limits evaluation for detection of true stenosis in these regions.   Original Report Authenticated By: Lacy Duverney, M.D.     Medications:  Scheduled: . aspirin  325 mg Oral Daily  . atorvastatin  20 mg Oral q1800  . enoxaparin (LOVENOX) injection  40 mg Subcutaneous Q24H  . loratadine  10 mg Oral Daily  . meloxicam  7.5 mg Oral Daily   Continuous:  ZOX:[id number]  Assesment: She had amnesia and it's not clear what that is from. Neurology consult is underway. Active Problems:   Brain TIA   Amnesia memory loss   Hyperlipidemia    Plan: I will discuss with the neurologist.    LOS: 2 days   Dorenda Pfannenstiel L 05/19/2012, 8:54 AM

## 2012-05-20 ENCOUNTER — Observation Stay (HOSPITAL_COMMUNITY)
Admit: 2012-05-20 | Discharge: 2012-05-20 | Disposition: A | Discharge status: Home / Self Care | Payer: Medicare Other | Attending: Internal Medicine | Admitting: Internal Medicine

## 2012-05-20 ENCOUNTER — Observation Stay (HOSPITAL_COMMUNITY): Payer: Medicare Other

## 2012-05-20 DIAGNOSIS — I6529 Occlusion and stenosis of unspecified carotid artery: Secondary | ICD-10-CM | POA: Diagnosis not present

## 2012-05-20 DIAGNOSIS — R569 Unspecified convulsions: Secondary | ICD-10-CM | POA: Diagnosis not present

## 2012-05-20 DIAGNOSIS — R4182 Altered mental status, unspecified: Secondary | ICD-10-CM | POA: Diagnosis not present

## 2012-05-20 NOTE — Progress Notes (Signed)
EEG completed.

## 2012-05-20 NOTE — Progress Notes (Signed)
Patient ID: Kristina [name], female   DOB: 11/09/1945, 67 y.o.   MRN: [id number]  [facility] NEUROLOGY [place] A. Gerilyn Pilgrim, MD     [email or web address]          Kristina [name] is an 67 y.o. female.   Assessment/Plan: Spell of amnesia and confusion thought likely to due to complex partial seizures. The patient still has EEG which is scheduled to be done today.  The patient reports no other recurrent spells. She does not complainof having a mild headache.  She is awake and alert. She is lucid and coherent. Pupils are reactive and extraocular movements are full. Facial muscles symmetric. Visual fields are intact. She has good strength bilaterally and finger to nose is normal.    Objective: Vital signs in last 24 hours: Temp:  [97.4 F (36.3 C)-98.4 F (36.9 C)] 97.9 F (36.6 C) (03/20 0603) Pulse Rate:  [53-63] 53 (03/20 0603) Resp:  [16-20] 20 (03/20 0603) BP: (99-118)/(59-63) 118/59 mmHg (03/20 0603) SpO2:  [94 %-96 %] 95 % (03/20 0603)  Intake/Output from previous day: 03/19 0701 - 03/20 0700 In: 960 [P.O.:960] Out: -  Intake/Output this shift:   Nutritional status: Carb Control   Lab Results: Results for orders placed during the hospital encounter of 05/17/12 (from the past 48 hour(s))  URINE RAPID DRUG SCREEN (HOSP PERFORMED)     Status: None   Collection Time    05/18/12  8:52 AM      Result Value Range   Opiates NONE DETECTED  NONE DETECTED   Cocaine NONE DETECTED  NONE DETECTED   Benzodiazepines NONE DETECTED  NONE DETECTED   Amphetamines NONE DETECTED  NONE DETECTED   Tetrahydrocannabinol NONE DETECTED  NONE DETECTED   Barbiturates NONE DETECTED  NONE DETECTED   Comment:            DRUG SCREEN FOR MEDICAL PURPOSES     ONLY.  IF CONFIRMATION IS NEEDED     FOR ANY PURPOSE, NOTIFY LAB     WITHIN 5 DAYS.                LOWEST DETECTABLE LIMITS     FOR URINE DRUG SCREEN     Drug Class       Cutoff (ng/mL)     Amphetamine      1000     Barbiturate      200    Benzodiazepine   200     Tricyclics       300     Opiates          300     Cocaine          300     THC              50  GLUCOSE, CAPILLARY     Status: None   Collection Time    05/18/12 11:40 AM      Result Value Range   Glucose-Capillary 95  70 - 99 mg/dL   Comment 1 Documented in Chart     Comment 2 Notify RN    GLUCOSE, CAPILLARY     Status: Abnormal   Collection Time    05/18/12  4:25 PM      Result Value Range   Glucose-Capillary 172 (*) 70 - 99 mg/dL   Comment 1 Notify RN     Comment 2 Documented in Chart    GLUCOSE, CAPILLARY     Status: None   Collection Time  05/18/12  8:56 PM      Result Value Range   Glucose-Capillary 97  70 - 99 mg/dL  GLUCOSE, CAPILLARY     Status: None   Collection Time    05/19/12  7:24 AM      Result Value Range   Glucose-Capillary 79  70 - 99 mg/dL  GLUCOSE, CAPILLARY     Status: None   Collection Time    05/19/12 11:50 AM      Result Value Range   Glucose-Capillary 95  70 - 99 mg/dL  GLUCOSE, CAPILLARY     Status: Abnormal   Collection Time    05/19/12  4:25 PM      Result Value Range   Glucose-Capillary 109 (*) 70 - 99 mg/dL   Comment 1 Notify RN    GLUCOSE, CAPILLARY     Status: Abnormal   Collection Time    05/19/12  9:02 PM      Result Value Range   Glucose-Capillary 135 (*) 70 - 99 mg/dL   Comment 1 Notify RN     Comment 2 Documented in Chart    GLUCOSE, CAPILLARY     Status: None   Collection Time    05/20/12  7:29 AM      Result Value Range   Glucose-Capillary 94  70 - 99 mg/dL    Lipid Panel  Recent Labs  05/18/12 0602  CHOL 238*  TRIG 135  HDL 49  CHOLHDL 4.9  VLDL 27  LDLCALC 147*    Studies/Results: Mr [facility] Head Wo Contrast  05/18/2012  *RADIOLOGY REPORT*  Clinical Data:  Acute onset of memory loss. Headaches.  Weakness. Hyperlipidemia.  MRI BRAIN WITHOUT CONTRAST MRA HEAD WITHOUT CONTRAST  Technique: Multiplanar, multiecho pulse sequences of the brain and surrounding structures were obtained  according to standard protocol without intravenous contrast.  Angiographic images of the head were obtained using MRA technique without contrast.  Comparison: 05/18/2012 CT.  06/21/2003 MR.  MRI HEAD  Findings:  No acute infarct.  Very mild nonspecific white matter type changes most likely related to result of small vessel disease.  No intracranial hemorrhage.  No intracranial mass lesion detected on this unenhanced exam.  No hydrocephalus.  No significant atrophy.  Partially empty sella unchanged and probably an incidental finding.  IMPRESSION: No acute infarct.  MRA HEAD  Findings: Regions of loss of signal most notable cavernous segment of the internal carotid artery bilaterally have an appearance more suggestive of artifact rather than true stenosis.  Anterior circulation otherwise without medium or large sized vessel significant stenosis or occlusion.  Fetal type origin of the posterior cerebral artery.  Right vertebral artery ends in a PICA distribution.  Areas of loss of signal of the right vertebral artery and PICA may be related to artifact rather than true stenosis.  Mild irregularity of portions of the left vertebral artery may represent result of artifact versus mild narrowing.  No high-grade stenosis of the basilar artery.  Nonvisualization left AICA.  Poor delineation of the full extent of the superior cerebellar artery bilaterally.  Branch vessel irregularity.  No aneurysm or vascular malformation noted.  IMPRESSION: Artifact suspected as contributing to areas of loss of signal involving portions of the anterior and posterior circulation as noted above.  This limits evaluation for detection of true stenosis in these regions.   Original Report Authenticated By: Lacy Duverney, M.D.    Mri Brain Without Contrast  05/18/2012  *RADIOLOGY REPORT*  Clinical Data:  Acute onset  of memory loss. Headaches.  Weakness. Hyperlipidemia.  MRI BRAIN WITHOUT CONTRAST MRA HEAD WITHOUT CONTRAST  Technique: Multiplanar,  multiecho pulse sequences of the brain and surrounding structures were obtained according to standard protocol without intravenous contrast.  Angiographic images of the head were obtained using MRA technique without contrast.  Comparison: 05/18/2012 CT.  06/21/2003 MR.  MRI HEAD  Findings:  No acute infarct.  Very mild nonspecific white matter type changes most likely related to result of small vessel disease.  No intracranial hemorrhage.  No intracranial mass lesion detected on this unenhanced exam.  No hydrocephalus.  No significant atrophy.  Partially empty sella unchanged and probably an incidental finding.  IMPRESSION: No acute infarct.  MRA HEAD  Findings: Regions of loss of signal most notable cavernous segment of the internal carotid artery bilaterally have an appearance more suggestive of artifact rather than true stenosis.  Anterior circulation otherwise without medium or large sized vessel significant stenosis or occlusion.  Fetal type origin of the posterior cerebral artery.  Right vertebral artery ends in a PICA distribution.  Areas of loss of signal of the right vertebral artery and PICA may be related to artifact rather than true stenosis.  Mild irregularity of portions of the left vertebral artery may represent result of artifact versus mild narrowing.  No high-grade stenosis of the basilar artery.  Nonvisualization left AICA.  Poor delineation of the full extent of the superior cerebellar artery bilaterally.  Branch vessel irregularity.  No aneurysm or vascular malformation noted.  IMPRESSION: Artifact suspected as contributing to areas of loss of signal involving portions of the anterior and posterior circulation as noted above.  This limits evaluation for detection of true stenosis in these regions.   Original Report Authenticated By: Lacy Duverney, M.D.     Medications:  Prior to Admission medications   Medication Sig Start Date End Date Taking? Authorizing Provider  cetirizine (ZYRTEC) 10  MG tablet Take 10 mg by mouth daily.   Yes Historical Provider, MD  CRESTOR 10 MG tablet Take 10 mg by mouth daily.  12/17/11  Yes Historical Provider, MD  Estradiol (VAGIFEM) 10 MCG TABS Place 10 mcg vaginally once a week.   Yes Historical Provider, MD  meloxicam (MOBIC) 7.5 MG tablet Take 7.5 mg by mouth daily.  02/09/12  Yes Historical Provider, MD  sodium chloride (OCEAN) 0.65 % nasal spray Place into the nose as needed for congestion.   Yes Historical Provider, MD       LOS: 3 days   Brittiany Wiehe A. Gerilyn Pilgrim, M.D.  Diplomate, Biomedical engineer of Psychiatry and Neurology ( Neurology).

## 2012-05-20 NOTE — Progress Notes (Signed)
Patient is alert, oriented, and stable; tests completed; IV and heart monitor removed. Follow up with Ouida Sills, MD 05/28/12. Pt desires to go home.

## 2012-05-20 NOTE — Progress Notes (Signed)
She feels well. She was unable to get her EEG done last night. That will be done today. She also has not had carotid study done so I ordered that. She has not had any further symptoms. She says she feels well.  Her blood pressure 118/59 pulse 53 respirations 20 temperature 97.9. Neurologically she is intact. Her chest is clear.  Medications are reviewed and unchanged  She will have carotid study an EEG and then be discharged

## 2012-05-21 NOTE — Discharge Summary (Signed)
Physician Discharge Summary  Patient ID: Kristina [name] MRN: [id number] DOB/AGE: 10-26-1945 67 y.o. Primary Care Physician:FAGAN,ROY, MD Admit date: 05/17/2012 Discharge date: 05/21/2012    Discharge Diagnoses:   Active Problems:   Brain TIA   Amnesia memory loss   Hyperlipidemia     Medication List    TAKE these medications       cetirizine 10 MG tablet  Commonly known as:  ZYRTEC  Take 10 mg by mouth daily.     CRESTOR 10 MG tablet  Generic drug:  rosuvastatin  Take 10 mg by mouth daily.     meloxicam 7.5 MG tablet  Commonly known as:  MOBIC  Take 7.5 mg by mouth daily.     sodium chloride 0.65 % nasal spray  Commonly known as:  OCEAN  Place into the nose as needed for congestion.     VAGIFEM 10 MCG Tabs  Generic drug:  Estradiol  Place 10 mcg vaginally once a week.        Discharged Condition: Improved    Consults: Neurology  Significant Diagnostic Studies: Ct Head Wo Contrast  05/18/2012  *RADIOLOGY REPORT*  Clinical Data: Acute memory loss tonight.  History of previous mini stroke affecting memory into 1005.  CT HEAD WITHOUT CONTRAST  Technique:  Contiguous axial images were obtained from the base of the skull through the vertex without contrast.  Comparison: MRI brain 06/21/2003.  CT head 06/19/2003.  Findings: Ventricles and sulci are normal for age.  There is some patchy low attenuation change in the deep white matter suggesting small vessel ischemia.  Suggestion of vague low attenuation change in the insular cortical regions bilaterally which appears stable since previous study.  There is no mass effect or midline shift. No abnormal extra-axial fluid collections.  Gray-white matter junctions are distinct.  Basal cisterns are not effaced.  No evidence of acute intracranial hemorrhage.  No depressed skull fractures.  Visualized paranasal sinuses and mastoid air cells are not opacified.  No significant change since previous study.  IMPRESSION: No acute  intracranial abnormality identified.  Patchy white matter disease suggesting chronic small vessel ischemia.   Original Report Authenticated By: Burman Nieves, M.D.    Mr [facility] Wo Contrast  05/18/2012  *RADIOLOGY REPORT*  Clinical Data:  Acute onset of memory loss. Headaches.  Weakness. Hyperlipidemia.  MRI BRAIN WITHOUT CONTRAST MRA HEAD WITHOUT CONTRAST  Technique: Multiplanar, multiecho pulse sequences of the brain and surrounding structures were obtained according to standard protocol without intravenous contrast.  Angiographic images of the head were obtained using MRA technique without contrast.  Comparison: 05/18/2012 CT.  06/21/2003 MR.  MRI HEAD  Findings:  No acute infarct.  Very mild nonspecific white matter type changes most likely related to result of small vessel disease.  No intracranial hemorrhage.  No intracranial mass lesion detected on this unenhanced exam.  No hydrocephalus.  No significant atrophy.  Partially empty sella unchanged and probably an incidental finding.  IMPRESSION: No acute infarct.  MRA HEAD  Findings: Regions of loss of signal most notable cavernous segment of the internal carotid artery bilaterally have an appearance more suggestive of artifact rather than true stenosis.  Anterior circulation otherwise without medium or large sized vessel significant stenosis or occlusion.  Fetal type origin of the posterior cerebral artery.  Right vertebral artery ends in a PICA distribution.  Areas of loss of signal of the right vertebral artery and PICA may be related to artifact rather than true stenosis.  Mild irregularity  of portions of the left vertebral artery may represent result of artifact versus mild narrowing.  No high-grade stenosis of the basilar artery.  Nonvisualization left AICA.  Poor delineation of the full extent of the superior cerebellar artery bilaterally.  Branch vessel irregularity.  No aneurysm or vascular malformation noted.  IMPRESSION: Artifact suspected as  contributing to areas of loss of signal involving portions of the anterior and posterior circulation as noted above.  This limits evaluation for detection of true stenosis in these regions.   Original Report Authenticated By: Lacy Duverney, M.D.    Mri Brain Without Contrast  05/18/2012  *RADIOLOGY REPORT*  Clinical Data:  Acute onset of memory loss. Headaches.  Weakness. Hyperlipidemia.  MRI BRAIN WITHOUT CONTRAST MRA HEAD WITHOUT CONTRAST  Technique: Multiplanar, multiecho pulse sequences of the brain and surrounding structures were obtained according to standard protocol without intravenous contrast.  Angiographic images of the head were obtained using MRA technique without contrast.  Comparison: 05/18/2012 CT.  06/21/2003 MR.  MRI HEAD  Findings:  No acute infarct.  Very mild nonspecific white matter type changes most likely related to result of small vessel disease.  No intracranial hemorrhage.  No intracranial mass lesion detected on this unenhanced exam.  No hydrocephalus.  No significant atrophy.  Partially empty sella unchanged and probably an incidental finding.  IMPRESSION: No acute infarct.  MRA HEAD  Findings: Regions of loss of signal most notable cavernous segment of the internal carotid artery bilaterally have an appearance more suggestive of artifact rather than true stenosis.  Anterior circulation otherwise without medium or large sized vessel significant stenosis or occlusion.  Fetal type origin of the posterior cerebral artery.  Right vertebral artery ends in a PICA distribution.  Areas of loss of signal of the right vertebral artery and PICA may be related to artifact rather than true stenosis.  Mild irregularity of portions of the left vertebral artery may represent result of artifact versus mild narrowing.  No high-grade stenosis of the basilar artery.  Nonvisualization left AICA.  Poor delineation of the full extent of the superior cerebellar artery bilaterally.  Branch vessel irregularity.   No aneurysm or vascular malformation noted.  IMPRESSION: Artifact suspected as contributing to areas of loss of signal involving portions of the anterior and posterior circulation as noted above.  This limits evaluation for detection of true stenosis in these regions.   Original Report Authenticated By: Lacy Duverney, M.D.    US Carotid Duplex Bilateral  05/20/2012  *RADIOLOGY REPORT*  Clinical Data: TIA  BILATERAL CAROTID DUPLEX ULTRASOUND  Technique: Wallace Cullens scale imaging, color Doppler and duplex ultrasound was performed of bilateral carotid and vertebral arteries in the neck.  Comparison:  None.  Criteria:  Quantification of carotid stenosis is based on velocity parameters that correlate the residual internal carotid diameter with NASCET-based stenosis levels, using the diameter of the distal internal carotid lumen as the denominator for stenosis measurement.  The following velocity measurements were obtained:                   PEAK SYSTOLIC/END DIASTOLIC RIGHT ICA:                        93cm/sec CCA:                        100cm/sec SYSTOLIC ICA/CCA RATIO:     0.94 DIASTOLIC ICA/CCA RATIO: ECA:  132cm/sec  LEFT ICA:                        104cm/sec CCA:                        89cm/sec SYSTOLIC ICA/CCA RATIO:     1.17 DIASTOLIC ICA/CCA RATIO: ECA:                        81cm/sec  Findings:  RIGHT CAROTID ARTERY: Mild mixed plaque in the bulb.  Low resistance internal carotid Doppler pattern.  RIGHT VERTEBRAL ARTERY:  Antegrade.  LEFT CAROTID ARTERY: Little if any plaque in the left bulb.  Low resistance internal carotid Doppler pattern.  LEFT VERTEBRAL ARTERY:  Antegrade.  IMPRESSION: Less than 50% stenosis in the right the left internal carotid arteries.   Original Report Authenticated By: Jolaine Click, M.D.     Lab Results: Basic Metabolic Panel: No results found for this basename: NA, K, CL, CO2, GLUCOSE, BUN, CREATININE, CALCIUM, MG, PHOS,  in the last 72 hours Liver Function  Tests: No results found for this basename: AST, ALT, ALKPHOS, BILITOT, PROT, ALBUMIN,  in the last 72 hours   CBC: No results found for this basename: WBC, NEUTROABS, HGB, HCT, MCV, PLT,  in the last 72 hours  No results found for this or any previous visit (from the past 240 hour(s)).   Hospital Course: She came to the hospital after an approximate two-hour period of amnesia. She has similar episode about 9 years ago. At that time she was felt to have TIA. She was evaluated with MRI scan and had urology consultation. There was no definite area of stroke seen on MRI/MRA. She had carotid study and EEG. She had no further similar episodes. She was stable for discharge. She will have close followup with Dr. Ouida Sills her primary care physician and with Dr. Gerilyn Pilgrim the neurologist  Discharge Exam: Blood pressure 117/61, pulse 62, temperature 97.7 F (36.5 C), temperature source Oral, resp. rate 20, height 5' 3.5" (1.613 m), weight 80.5 kg (177 lb 7.5 oz), SpO2 92.00%. She is neurologically grossly intact. Her chest is clear. Heart is regular.  Disposition: Home for close followup as above. She may need medications for potential seizure disorder but that will depend on the results of the EEG      Discharge Orders   Future Appointments Provider Department Dept Phone   06/21/2012 10:20 AM [facility] [place] [facility] [phone]   Patient should wear two piece clothing and wear no powder or deodorant. Patient should arrive 15 minutes early.   Future Orders Complete By Expires     Discharge patient  As directed     Comments:      After EEG done         Signed: Ki Luckman L Pager [phone]  05/21/2012, 7:52 AM

## 2012-05-21 NOTE — Procedures (Signed)
[name], [name]                ACCOUNT NO.:  [id number]  MEDICAL RECORD NO.:  [id number]  LOCATION:  EE                           FACILITY:  [facility]  PHYSICIAN:  Iness Pangilinan A. Gerilyn Pilgrim, M.D. DATE OF BIRTH:  [birth year]  DATE OF PROCEDURE: DATE OF DISCHARGE:  05/20/2012                             EEG INTERPRETATION   HISTORY:  This is a 67 year old lady who reports a spell of unresponsiveness and confusion.  The study is being done to evaluate for nonconvulsive seizures.  MEDICATIONS:  Lipitor, Mobic, Claritin.  ANALYSIS:  This is a 16-channel recording using standard 10/20 measurements, is conducted for 22 minutes.  There is a well-formed posterior dominant rhythm of 10 Hz, which attenuates with eye opening. There is beta activity observed in the frontal areas.  Awake and drowsy activities are recorded.  Photic stimulation and hypoventilation are carried out without abnormal changes in the background activity.  There is no focal or lateralized slowing.  There is a simple sharp wave activity that phase reverses at T4.  IMPRESSION:  Mildly abnormal recording showing a sharp wave activity at T4, which could represent epileptic focus, otherwise unremarkable recording.     Memory [name], M.D.     KAD/MEDQ  D:  05/21/2012  T:  05/21/2012  Job:  [id number]

## 2012-05-28 DIAGNOSIS — R569 Unspecified convulsions: Secondary | ICD-10-CM | POA: Diagnosis not present

## 2012-06-21 ENCOUNTER — Ambulatory Visit
Admission: RE | Admit: 2012-06-21 | Discharge: 2012-06-21 | Disposition: A | Discharge status: Home / Self Care | Payer: Medicare Other | Source: Ambulatory Visit | Attending: Obstetrics & Gynecology | Admitting: Obstetrics & Gynecology

## 2012-06-21 DIAGNOSIS — Z1231 Encounter for screening mammogram for malignant neoplasm of breast: Secondary | ICD-10-CM

## 2012-08-05 DIAGNOSIS — R569 Unspecified convulsions: Secondary | ICD-10-CM | POA: Diagnosis not present

## 2013-01-18 DIAGNOSIS — E785 Hyperlipidemia, unspecified: Secondary | ICD-10-CM | POA: Diagnosis not present

## 2013-01-18 DIAGNOSIS — M1990 Unspecified osteoarthritis, unspecified site: Secondary | ICD-10-CM | POA: Diagnosis not present

## 2013-01-18 DIAGNOSIS — Z79899 Other long term (current) drug therapy: Secondary | ICD-10-CM | POA: Diagnosis not present

## 2013-01-25 DIAGNOSIS — E785 Hyperlipidemia, unspecified: Secondary | ICD-10-CM | POA: Diagnosis not present

## 2013-01-25 DIAGNOSIS — M1990 Unspecified osteoarthritis, unspecified site: Secondary | ICD-10-CM | POA: Diagnosis not present

## 2013-01-25 DIAGNOSIS — Z23 Encounter for immunization: Secondary | ICD-10-CM | POA: Diagnosis not present

## 2013-02-01 DIAGNOSIS — M67919 Unspecified disorder of synovium and tendon, unspecified shoulder: Secondary | ICD-10-CM | POA: Diagnosis not present

## 2013-02-01 DIAGNOSIS — IMO0002 Reserved for concepts with insufficient information to code with codable children: Secondary | ICD-10-CM | POA: Diagnosis not present

## 2013-03-15 DIAGNOSIS — H33309 Unspecified retinal break, unspecified eye: Secondary | ICD-10-CM | POA: Diagnosis not present

## 2013-03-15 DIAGNOSIS — H5200 Hypermetropia, unspecified eye: Secondary | ICD-10-CM | POA: Diagnosis not present

## 2013-03-15 DIAGNOSIS — H2510 Age-related nuclear cataract, unspecified eye: Secondary | ICD-10-CM | POA: Diagnosis not present

## 2013-03-15 DIAGNOSIS — H43819 Vitreous degeneration, unspecified eye: Secondary | ICD-10-CM | POA: Diagnosis not present

## 2013-04-05 DIAGNOSIS — H33309 Unspecified retinal break, unspecified eye: Secondary | ICD-10-CM | POA: Diagnosis not present

## 2013-07-12 DIAGNOSIS — H33309 Unspecified retinal break, unspecified eye: Secondary | ICD-10-CM | POA: Diagnosis not present

## 2013-07-26 DIAGNOSIS — M1990 Unspecified osteoarthritis, unspecified site: Secondary | ICD-10-CM | POA: Diagnosis not present

## 2013-12-30 DIAGNOSIS — D239 Other benign neoplasm of skin, unspecified: Secondary | ICD-10-CM | POA: Diagnosis not present

## 2013-12-30 DIAGNOSIS — B078 Other viral warts: Secondary | ICD-10-CM | POA: Diagnosis not present

## 2013-12-30 DIAGNOSIS — D485 Neoplasm of uncertain behavior of skin: Secondary | ICD-10-CM | POA: Diagnosis not present

## 2013-12-30 DIAGNOSIS — D229 Melanocytic nevi, unspecified: Secondary | ICD-10-CM | POA: Diagnosis not present

## 2013-12-30 DIAGNOSIS — D1801 Hemangioma of skin and subcutaneous tissue: Secondary | ICD-10-CM | POA: Diagnosis not present

## 2013-12-30 DIAGNOSIS — L820 Inflamed seborrheic keratosis: Secondary | ICD-10-CM | POA: Diagnosis not present

## 2014-01-23 DIAGNOSIS — M1990 Unspecified osteoarthritis, unspecified site: Secondary | ICD-10-CM | POA: Diagnosis not present

## 2014-01-23 DIAGNOSIS — Z79899 Other long term (current) drug therapy: Secondary | ICD-10-CM | POA: Diagnosis not present

## 2014-01-23 DIAGNOSIS — E785 Hyperlipidemia, unspecified: Secondary | ICD-10-CM | POA: Diagnosis not present

## 2014-01-30 DIAGNOSIS — M1990 Unspecified osteoarthritis, unspecified site: Secondary | ICD-10-CM | POA: Diagnosis not present

## 2014-01-30 DIAGNOSIS — Z23 Encounter for immunization: Secondary | ICD-10-CM | POA: Diagnosis not present

## 2014-01-30 DIAGNOSIS — E785 Hyperlipidemia, unspecified: Secondary | ICD-10-CM | POA: Diagnosis not present

## 2014-01-31 ENCOUNTER — Other Ambulatory Visit (HOSPITAL_COMMUNITY): Payer: Self-pay | Admitting: Obstetrics & Gynecology

## 2014-01-31 DIAGNOSIS — Z1231 Encounter for screening mammogram for malignant neoplasm of breast: Secondary | ICD-10-CM

## 2014-02-06 ENCOUNTER — Ambulatory Visit (HOSPITAL_COMMUNITY)
Admission: RE | Admit: 2014-02-06 | Discharge: 2014-02-06 | Disposition: A | Discharge status: Home / Self Care | Payer: Medicare Other | Source: Ambulatory Visit | Attending: Obstetrics & Gynecology | Admitting: Obstetrics & Gynecology

## 2014-02-06 DIAGNOSIS — Z1231 Encounter for screening mammogram for malignant neoplasm of breast: Secondary | ICD-10-CM | POA: Insufficient documentation

## 2014-05-12 DIAGNOSIS — B353 Tinea pedis: Secondary | ICD-10-CM | POA: Diagnosis not present

## 2014-07-24 DIAGNOSIS — H2513 Age-related nuclear cataract, bilateral: Secondary | ICD-10-CM | POA: Diagnosis not present

## 2014-07-24 DIAGNOSIS — H43813 Vitreous degeneration, bilateral: Secondary | ICD-10-CM | POA: Diagnosis not present

## 2014-07-24 DIAGNOSIS — H33301 Unspecified retinal break, right eye: Secondary | ICD-10-CM | POA: Diagnosis not present

## 2014-07-24 DIAGNOSIS — H5203 Hypermetropia, bilateral: Secondary | ICD-10-CM | POA: Diagnosis not present

## 2015-01-05 DIAGNOSIS — I781 Nevus, non-neoplastic: Secondary | ICD-10-CM | POA: Diagnosis not present

## 2015-01-05 DIAGNOSIS — L821 Other seborrheic keratosis: Secondary | ICD-10-CM | POA: Diagnosis not present

## 2015-01-05 DIAGNOSIS — D229 Melanocytic nevi, unspecified: Secondary | ICD-10-CM | POA: Diagnosis not present

## 2015-01-31 DIAGNOSIS — Z79899 Other long term (current) drug therapy: Secondary | ICD-10-CM | POA: Diagnosis not present

## 2015-01-31 DIAGNOSIS — M1990 Unspecified osteoarthritis, unspecified site: Secondary | ICD-10-CM | POA: Diagnosis not present

## 2015-01-31 DIAGNOSIS — E785 Hyperlipidemia, unspecified: Secondary | ICD-10-CM | POA: Diagnosis not present

## 2015-02-05 DIAGNOSIS — M1990 Unspecified osteoarthritis, unspecified site: Secondary | ICD-10-CM | POA: Diagnosis not present

## 2015-02-05 DIAGNOSIS — Z6833 Body mass index (BMI) 33.0-33.9, adult: Secondary | ICD-10-CM | POA: Diagnosis not present

## 2015-02-05 DIAGNOSIS — E785 Hyperlipidemia, unspecified: Secondary | ICD-10-CM | POA: Diagnosis not present

## 2015-02-05 DIAGNOSIS — Z23 Encounter for immunization: Secondary | ICD-10-CM | POA: Diagnosis not present

## 2015-02-27 ENCOUNTER — Other Ambulatory Visit (HOSPITAL_COMMUNITY): Payer: Self-pay | Admitting: Internal Medicine

## 2015-02-27 DIAGNOSIS — Z1231 Encounter for screening mammogram for malignant neoplasm of breast: Secondary | ICD-10-CM

## 2015-03-02 ENCOUNTER — Ambulatory Visit (HOSPITAL_COMMUNITY)
Admission: RE | Admit: 2015-03-02 | Discharge: 2015-03-02 | Disposition: A | Discharge status: Home / Self Care | Payer: Medicare Other | Source: Ambulatory Visit | Attending: Internal Medicine | Admitting: Internal Medicine

## 2015-03-02 DIAGNOSIS — Z1231 Encounter for screening mammogram for malignant neoplasm of breast: Secondary | ICD-10-CM | POA: Diagnosis not present

## 2015-03-09 ENCOUNTER — Other Ambulatory Visit: Payer: Self-pay | Admitting: Internal Medicine

## 2015-03-09 DIAGNOSIS — R928 Other abnormal and inconclusive findings on diagnostic imaging of breast: Secondary | ICD-10-CM

## 2015-03-12 ENCOUNTER — Other Ambulatory Visit: Payer: Self-pay | Admitting: Internal Medicine

## 2015-03-12 DIAGNOSIS — R928 Other abnormal and inconclusive findings on diagnostic imaging of breast: Secondary | ICD-10-CM

## 2015-03-15 ENCOUNTER — Ambulatory Visit
Admission: RE | Admit: 2015-03-15 | Discharge: 2015-03-15 | Disposition: A | Discharge status: Home / Self Care | Payer: Medicare Other | Source: Ambulatory Visit | Attending: Internal Medicine | Admitting: Internal Medicine

## 2015-03-15 DIAGNOSIS — R928 Other abnormal and inconclusive findings on diagnostic imaging of breast: Secondary | ICD-10-CM

## 2015-03-15 DIAGNOSIS — N63 Unspecified lump in breast: Secondary | ICD-10-CM | POA: Diagnosis not present

## 2015-03-15 DIAGNOSIS — N6002 Solitary cyst of left breast: Secondary | ICD-10-CM | POA: Diagnosis not present

## 2015-08-20 DIAGNOSIS — H524 Presbyopia: Secondary | ICD-10-CM | POA: Diagnosis not present

## 2015-08-20 DIAGNOSIS — H2513 Age-related nuclear cataract, bilateral: Secondary | ICD-10-CM | POA: Diagnosis not present

## 2015-08-20 DIAGNOSIS — H33301 Unspecified retinal break, right eye: Secondary | ICD-10-CM | POA: Diagnosis not present

## 2015-08-20 DIAGNOSIS — H43813 Vitreous degeneration, bilateral: Secondary | ICD-10-CM | POA: Diagnosis not present

## 2015-11-12 DIAGNOSIS — Z6833 Body mass index (BMI) 33.0-33.9, adult: Secondary | ICD-10-CM | POA: Diagnosis not present

## 2015-11-12 DIAGNOSIS — L237 Allergic contact dermatitis due to plants, except food: Secondary | ICD-10-CM | POA: Diagnosis not present

## 2015-12-10 DIAGNOSIS — Z23 Encounter for immunization: Secondary | ICD-10-CM | POA: Diagnosis not present

## 2016-01-29 DIAGNOSIS — Z79899 Other long term (current) drug therapy: Secondary | ICD-10-CM | POA: Diagnosis not present

## 2016-01-29 DIAGNOSIS — M1990 Unspecified osteoarthritis, unspecified site: Secondary | ICD-10-CM | POA: Diagnosis not present

## 2016-01-29 DIAGNOSIS — E785 Hyperlipidemia, unspecified: Secondary | ICD-10-CM | POA: Diagnosis not present

## 2016-02-04 DIAGNOSIS — M1990 Unspecified osteoarthritis, unspecified site: Secondary | ICD-10-CM | POA: Diagnosis not present

## 2016-02-04 DIAGNOSIS — Z23 Encounter for immunization: Secondary | ICD-10-CM | POA: Diagnosis not present

## 2016-02-04 DIAGNOSIS — E785 Hyperlipidemia, unspecified: Secondary | ICD-10-CM | POA: Diagnosis not present

## 2016-02-12 ENCOUNTER — Encounter: Payer: Self-pay | Admitting: Gastroenterology

## 2016-02-20 DIAGNOSIS — D235 Other benign neoplasm of skin of trunk: Secondary | ICD-10-CM | POA: Diagnosis not present

## 2016-02-20 DIAGNOSIS — I781 Nevus, non-neoplastic: Secondary | ICD-10-CM | POA: Diagnosis not present

## 2016-02-20 DIAGNOSIS — D229 Melanocytic nevi, unspecified: Secondary | ICD-10-CM | POA: Diagnosis not present

## 2016-02-20 DIAGNOSIS — D2370 Other benign neoplasm of skin of unspecified lower limb, including hip: Secondary | ICD-10-CM | POA: Diagnosis not present

## 2016-02-20 DIAGNOSIS — L821 Other seborrheic keratosis: Secondary | ICD-10-CM | POA: Diagnosis not present

## 2016-02-20 DIAGNOSIS — D239 Other benign neoplasm of skin, unspecified: Secondary | ICD-10-CM | POA: Diagnosis not present

## 2016-02-20 DIAGNOSIS — Z808 Family history of malignant neoplasm of other organs or systems: Secondary | ICD-10-CM | POA: Diagnosis not present

## 2016-03-17 DIAGNOSIS — Z23 Encounter for immunization: Secondary | ICD-10-CM | POA: Diagnosis not present

## 2016-05-01 DIAGNOSIS — Z6835 Body mass index (BMI) 35.0-35.9, adult: Secondary | ICD-10-CM | POA: Diagnosis not present

## 2016-05-01 DIAGNOSIS — N390 Urinary tract infection, site not specified: Secondary | ICD-10-CM | POA: Diagnosis not present

## 2016-06-02 DIAGNOSIS — I10 Essential (primary) hypertension: Secondary | ICD-10-CM | POA: Diagnosis not present

## 2016-06-02 DIAGNOSIS — E785 Hyperlipidemia, unspecified: Secondary | ICD-10-CM | POA: Diagnosis not present

## 2016-10-07 DIAGNOSIS — H2513 Age-related nuclear cataract, bilateral: Secondary | ICD-10-CM | POA: Diagnosis not present

## 2016-10-07 DIAGNOSIS — H33301 Unspecified retinal break, right eye: Secondary | ICD-10-CM | POA: Diagnosis not present

## 2016-10-07 DIAGNOSIS — H43813 Vitreous degeneration, bilateral: Secondary | ICD-10-CM | POA: Diagnosis not present

## 2016-10-07 DIAGNOSIS — H5203 Hypermetropia, bilateral: Secondary | ICD-10-CM | POA: Diagnosis not present

## 2016-10-23 DIAGNOSIS — I10 Essential (primary) hypertension: Secondary | ICD-10-CM | POA: Diagnosis not present

## 2016-10-23 DIAGNOSIS — M1990 Unspecified osteoarthritis, unspecified site: Secondary | ICD-10-CM | POA: Diagnosis not present

## 2017-01-06 DIAGNOSIS — Z23 Encounter for immunization: Secondary | ICD-10-CM | POA: Diagnosis not present

## 2017-01-27 DIAGNOSIS — Z79899 Other long term (current) drug therapy: Secondary | ICD-10-CM | POA: Diagnosis not present

## 2017-01-27 DIAGNOSIS — M1990 Unspecified osteoarthritis, unspecified site: Secondary | ICD-10-CM | POA: Diagnosis not present

## 2017-01-27 DIAGNOSIS — I10 Essential (primary) hypertension: Secondary | ICD-10-CM | POA: Diagnosis not present

## 2017-01-27 DIAGNOSIS — E785 Hyperlipidemia, unspecified: Secondary | ICD-10-CM | POA: Diagnosis not present

## 2017-02-02 DIAGNOSIS — E785 Hyperlipidemia, unspecified: Secondary | ICD-10-CM | POA: Diagnosis not present

## 2017-02-02 DIAGNOSIS — I10 Essential (primary) hypertension: Secondary | ICD-10-CM | POA: Diagnosis not present

## 2017-03-04 DIAGNOSIS — D1801 Hemangioma of skin and subcutaneous tissue: Secondary | ICD-10-CM | POA: Diagnosis not present

## 2017-03-04 DIAGNOSIS — L821 Other seborrheic keratosis: Secondary | ICD-10-CM | POA: Diagnosis not present

## 2017-03-04 DIAGNOSIS — D229 Melanocytic nevi, unspecified: Secondary | ICD-10-CM | POA: Diagnosis not present

## 2017-03-04 DIAGNOSIS — D239 Other benign neoplasm of skin, unspecified: Secondary | ICD-10-CM | POA: Diagnosis not present

## 2017-03-26 DIAGNOSIS — N3021 Other chronic cystitis with hematuria: Secondary | ICD-10-CM | POA: Diagnosis not present

## 2017-06-24 ENCOUNTER — Ambulatory Visit (INDEPENDENT_AMBULATORY_CARE_PROVIDER_SITE_OTHER): Payer: Medicare Other | Admitting: Urology

## 2017-06-24 ENCOUNTER — Other Ambulatory Visit (HOSPITAL_COMMUNITY)
Admission: RE | Admit: 2017-06-24 | Discharge: 2017-06-24 | Disposition: A | Discharge status: Home / Self Care | Payer: Medicare Other | Source: Ambulatory Visit | Attending: Urology | Admitting: Urology

## 2017-06-24 DIAGNOSIS — N3021 Other chronic cystitis with hematuria: Secondary | ICD-10-CM | POA: Diagnosis not present

## 2017-06-24 DIAGNOSIS — N3000 Acute cystitis without hematuria: Secondary | ICD-10-CM | POA: Insufficient documentation

## 2017-06-27 LAB — URINE CULTURE: Culture: >=100000 — AB

## 2017-08-18 DIAGNOSIS — M1990 Unspecified osteoarthritis, unspecified site: Secondary | ICD-10-CM | POA: Diagnosis not present

## 2017-08-18 DIAGNOSIS — I10 Essential (primary) hypertension: Secondary | ICD-10-CM | POA: Diagnosis not present

## 2017-08-18 DIAGNOSIS — Z6835 Body mass index (BMI) 35.0-35.9, adult: Secondary | ICD-10-CM | POA: Diagnosis not present

## 2017-09-23 ENCOUNTER — Ambulatory Visit (INDEPENDENT_AMBULATORY_CARE_PROVIDER_SITE_OTHER): Payer: Medicare Other | Admitting: Urology

## 2017-09-23 DIAGNOSIS — N3021 Other chronic cystitis with hematuria: Secondary | ICD-10-CM

## 2017-09-23 DIAGNOSIS — N952 Postmenopausal atrophic vaginitis: Secondary | ICD-10-CM

## 2017-10-19 DIAGNOSIS — H524 Presbyopia: Secondary | ICD-10-CM | POA: Diagnosis not present

## 2017-10-19 DIAGNOSIS — H43813 Vitreous degeneration, bilateral: Secondary | ICD-10-CM | POA: Diagnosis not present

## 2017-10-19 DIAGNOSIS — H33301 Unspecified retinal break, right eye: Secondary | ICD-10-CM | POA: Diagnosis not present

## 2017-10-19 DIAGNOSIS — H2513 Age-related nuclear cataract, bilateral: Secondary | ICD-10-CM | POA: Diagnosis not present

## 2017-12-03 ENCOUNTER — Ambulatory Visit: Payer: Medicare Other

## 2017-12-14 ENCOUNTER — Ambulatory Visit (INDEPENDENT_AMBULATORY_CARE_PROVIDER_SITE_OTHER): Payer: Self-pay

## 2017-12-14 DIAGNOSIS — Z86010 Personal history of colonic polyps: Secondary | ICD-10-CM

## 2017-12-14 MED ORDER — NA SULFATE-K SULFATE-MG SULF 17.5-3.13-1.6 GM/177ML PO SOLN: 1.0000 | ORAL | 0 refills | Status: DC

## 2017-12-14 NOTE — Progress Notes (Addendum)
oGastroenterology Pre-Procedure Review  Request Date:12/14/17 Requesting Physician: 5 year recall- last [facility]- 2014 no polyps but had polyps on 2008 [facility]  PATIENT REVIEW QUESTIONS: The patient responded to the following health history questions as indicated:    1. Diabetes Melitis: no 2. Joint replacements in the past 12 months: no 3. Major health problems in the past 3 months: no 4. Has an artificial valve or MVP: no 5. Has a defibrillator: no 6. Has been advised in past to take antibiotics in advance of a procedure like teeth cleaning: no 7. Family history of colon cancer: yes (grandmother)  55. Alcohol Use: no 9. History of sleep apnea: no  10. History of coronary artery or other vascular stents placed within the last 12 months: no 11. History of any prior anesthesia complications: no    MEDICATIONS & ALLERGIES:    Patient reports the following regarding taking any blood thinners:   Plavix? no Aspirin? no Coumadin? no Brilinta? no Xarelto? no Eliquis? no Pradaxa? no Savaysa? no Effient? no  Patient confirms/reports the following medications:  Current Outpatient Medications  Medication Sig Dispense Refill  . amLODipine (NORVASC) 2.5 MG tablet     . CRESTOR 10 MG tablet Take 10 mg by mouth daily.     Marland Kitchen loratadine (CLARITIN) 10 MG tablet Take 10 mg by mouth daily as needed for allergies.    . meloxicam (MOBIC) 7.5 MG tablet Take 7.5 mg by mouth daily.     . sodium chloride (OCEAN) 0.65 % nasal spray Place into the nose as needed for congestion.     No current facility-administered medications for this visit.     Patient confirms/reports the following allergies:  Allergies  Allergen Reactions  . Lactose Intolerance (Gi) Other (See Comments)    Upset Stomach     No orders of the defined types were placed in this encounter.   AUTHORIZATION INFORMATION Primary Insurance: medicare,  ID #: [id number] Pre-Cert / Josem Kaufmann required: no  SCHEDULE INFORMATION: Procedure  has been scheduled as follows:  Date: 02/08/18, Time: 9:30 Location: [facility] [address]  This Gastroenterology Pre-Precedure Review Form is being routed to the following provider(s): Neil Crouch, PA

## 2017-12-14 NOTE — Patient Instructions (Addendum)
Kristina [name]  [birth year] MRN: [id number]     Procedure Date: 04/02/18 Time to register: 8:30am Place to register: [facility] Procedure Time: 9:30am Scheduled provider: Barney Drain, MD    PREPARATION FOR COLONOSCOPY WITH SUPREP BOWEL PREP KIT  Note: Suprep Bowel Prep Kit is a split-dose (2day) regimen. Consumption of BOTH 6-ounce bottles is required for a complete prep.  Please notify us immediately if you are diabetic, take iron supplements, or if you are on Coumadin or any other blood thinners.                                                                                                                                                  1 DAY BEFORE PROCEDURE:  DATE: 04/01/18  DAY: Thursday  clear liquids the entire day - NO SOLID FOOD.   At 6:00pm: Complete steps 1 through 4 below, using ONE (1) 6-ounce bottle, before going to bed. Step 1:  Pour ONE (1) 6-ounce bottle of SUPREP liquid into the mixing container.  Step 2:  Add cool drinking water to the 16 ounce line on the container and mix.  Note: Dilute the solution concentrate as directed prior to use. Step 3:  DRINK ALL the liquid in the container. Step 4:  You MUST drink an additional two (2) or more 16 ounce containers of water over the next one (1) hour.   Continue clear liquids.  DAY OF PROCEDURE:   DATE: 04/02/18   DAY: Friday If you take medications for your heart, blood pressure, or breathing, you may take these medications.   5 hours before your procedure at :4:30am Step 1:  Pour ONE (1) 6-ounce bottle of SUPREP liquid into the mixing container.  Step 2:  Add cool drinking water to the 16 ounce line on the container and mix.  Note: Dilute the solution concentrate as directed prior to use. Step 3:  DRINK ALL the liquid in the container. Step 4:  You MUST drink an additional two (2) or more 16 ounce containers of water over the next one (1) hour. You MUST complete the final glass of water at least 3 hours  before your colonoscopy.   Nothing by mouth past 6:30am  You may take your morning medications with sip of water unless we have instructed otherwise.    Please see below for Dietary Information.  CLEAR LIQUIDS INCLUDE:  Water Jello (NOT red in color)   Ice Popsicles (NOT red in color)   Tea (sugar ok, no milk/cream) Powdered fruit flavored drinks  Coffee (sugar ok, no milk/cream) Gatorade/ Lemonade/ Kool-Aid  (NOT red in color)   Juice: apple, white grape, white cranberry Soft drinks  Clear bullion, consomme, broth (fat free beef/chicken/vegetable)  Carbonated beverages (any kind)  Strained chicken noodle soup Hard Candy   Remember: Clear liquids are liquids that will  allow you to see your fingers on the other side of a clear glass. Be sure liquids are NOT red in color, and not cloudy, but CLEAR.  DO NOT EAT OR DRINK ANY OF THE FOLLOWING:  Dairy products of any kind   Cranberry juice Tomato juice / V8 juice   Grapefruit juice Orange juice     Red grape juice  Do not eat any solid foods, including such foods as: cereal, oatmeal, yogurt, fruits, vegetables, creamed soups, eggs, bread, crackers, pureed foods in a blender, etc.   HELPFUL HINTS FOR DRINKING PREP SOLUTION:   Make sure prep is extremely cold. Mix and refrigerate the the morning of the prep. You may also put in the freezer.   You may try mixing some Crystal Light or Country Time Lemonade if you prefer. Mix in small amounts; add more if necessary.  Try drinking through a straw  Rinse mouth with water or a mouthwash between glasses, to remove after-taste.  Try sipping on a cold beverage /ice/ popsicles between glasses of prep.  Place a piece of sugar-free hard candy in mouth between glasses.  If you become nauseated, try consuming smaller amounts, or stretch out the time between glasses. Stop for 30-60 minutes, then slowly start back drinking.     OTHER INSTRUCTIONS  You will need a responsible adult at least  71 years of age to accompany you and drive you home. This person must remain in the waiting room during your procedure. The hospital will cancel your procedure if you do not have a responsible adult with you.   1. Wear loose fitting clothing that is easily removed. 2. Leave jewelry and other valuables at home.  3. Remove all body piercing jewelry and leave at home. 4. Total time from sign-in until discharge is approximately 2-3 hours. 5. You should go home directly after your procedure and rest. You can resume normal activities the day after your procedure. 6. The day of your procedure you should not:  Drive  Make legal decisions  Operate machinery  Drink alcohol  Return to work   You may call the office (Dept: [phone]) before 5:00pm, or page the doctor on call [phone]) after 5:00pm, for further instructions, if necessary.   Insurance Information YOU WILL NEED TO CHECK WITH YOUR INSURANCE COMPANY FOR THE BENEFITS OF COVERAGE YOU HAVE FOR THIS PROCEDURE.  UNFORTUNATELY, NOT ALL INSURANCE COMPANIES HAVE BENEFITS TO COVER ALL OR PART OF THESE TYPES OF PROCEDURES.  IT IS YOUR RESPONSIBILITY TO CHECK YOUR BENEFITS, HOWEVER, WE WILL BE GLAD TO ASSIST YOU WITH ANY CODES YOUR INSURANCE COMPANY MAY NEED.    PLEASE NOTE THAT MOST INSURANCE COMPANIES WILL NOT COVER A SCREENING COLONOSCOPY FOR PEOPLE UNDER THE AGE OF 50  IF YOU HAVE BCBS INSURANCE, YOU MAY HAVE BENEFITS FOR A SCREENING COLONOSCOPY BUT IF POLYPS ARE FOUND THE DIAGNOSIS WILL CHANGE AND THEN YOU MAY HAVE A DEDUCTIBLE THAT WILL NEED TO BE MET. SO PLEASE MAKE SURE YOU CHECK YOUR BENEFITS FOR A SCREENING COLONOSCOPY AS WELL AS A DIAGNOSTIC COLONOSCOPY.

## 2017-12-15 NOTE — Progress Notes (Signed)
Ok to schedule.

## 2018-01-01 DIAGNOSIS — Z23 Encounter for immunization: Secondary | ICD-10-CM | POA: Diagnosis not present

## 2018-01-14 DIAGNOSIS — H25811 Combined forms of age-related cataract, right eye: Secondary | ICD-10-CM | POA: Diagnosis not present

## 2018-01-14 DIAGNOSIS — H2511 Age-related nuclear cataract, right eye: Secondary | ICD-10-CM | POA: Diagnosis not present

## 2018-01-19 NOTE — Progress Notes (Signed)
Pt called- she is having cataract [facility] this [facility]. Asked to move to January- I have moved her to 04/02/18 and mailed new instructions. Called endo, LM to move pt.

## 2018-02-04 DIAGNOSIS — H25812 Combined forms of age-related cataract, left eye: Secondary | ICD-10-CM | POA: Diagnosis not present

## 2018-02-04 DIAGNOSIS — H2512 Age-related nuclear cataract, left eye: Secondary | ICD-10-CM | POA: Diagnosis not present

## 2018-02-10 DIAGNOSIS — I10 Essential (primary) hypertension: Secondary | ICD-10-CM | POA: Diagnosis not present

## 2018-02-10 DIAGNOSIS — M1990 Unspecified osteoarthritis, unspecified site: Secondary | ICD-10-CM | POA: Diagnosis not present

## 2018-02-10 DIAGNOSIS — Z79899 Other long term (current) drug therapy: Secondary | ICD-10-CM | POA: Diagnosis not present

## 2018-02-16 DIAGNOSIS — E785 Hyperlipidemia, unspecified: Secondary | ICD-10-CM | POA: Diagnosis not present

## 2018-02-16 DIAGNOSIS — Z6835 Body mass index (BMI) 35.0-35.9, adult: Secondary | ICD-10-CM | POA: Diagnosis not present

## 2018-02-16 DIAGNOSIS — I10 Essential (primary) hypertension: Secondary | ICD-10-CM | POA: Diagnosis not present

## 2018-02-17 ENCOUNTER — Other Ambulatory Visit (HOSPITAL_COMMUNITY): Payer: Self-pay | Admitting: Internal Medicine

## 2018-02-17 DIAGNOSIS — Z1231 Encounter for screening mammogram for malignant neoplasm of breast: Secondary | ICD-10-CM

## 2018-03-10 DIAGNOSIS — L821 Other seborrheic keratosis: Secondary | ICD-10-CM | POA: Diagnosis not present

## 2018-03-10 DIAGNOSIS — Z1283 Encounter for screening for malignant neoplasm of skin: Secondary | ICD-10-CM | POA: Diagnosis not present

## 2018-03-10 DIAGNOSIS — D239 Other benign neoplasm of skin, unspecified: Secondary | ICD-10-CM | POA: Diagnosis not present

## 2018-03-10 DIAGNOSIS — D1801 Hemangioma of skin and subcutaneous tissue: Secondary | ICD-10-CM | POA: Diagnosis not present

## 2018-03-10 DIAGNOSIS — I781 Nevus, non-neoplastic: Secondary | ICD-10-CM | POA: Diagnosis not present

## 2018-03-10 DIAGNOSIS — Z7189 Other specified counseling: Secondary | ICD-10-CM | POA: Diagnosis not present

## 2018-03-10 DIAGNOSIS — D229 Melanocytic nevi, unspecified: Secondary | ICD-10-CM | POA: Diagnosis not present

## 2018-03-11 ENCOUNTER — Ambulatory Visit (HOSPITAL_COMMUNITY)
Admission: RE | Admit: 2018-03-11 | Discharge: 2018-03-11 | Disposition: A | Discharge status: Home / Self Care | Payer: Medicare Other | Source: Ambulatory Visit | Attending: Internal Medicine | Admitting: Internal Medicine

## 2018-03-11 ENCOUNTER — Encounter (HOSPITAL_COMMUNITY): Payer: Self-pay

## 2018-03-11 DIAGNOSIS — Z1231 Encounter for screening mammogram for malignant neoplasm of breast: Secondary | ICD-10-CM

## 2018-03-31 ENCOUNTER — Ambulatory Visit (INDEPENDENT_AMBULATORY_CARE_PROVIDER_SITE_OTHER): Payer: Medicare Other | Admitting: Urology

## 2018-03-31 DIAGNOSIS — N952 Postmenopausal atrophic vaginitis: Secondary | ICD-10-CM

## 2018-03-31 DIAGNOSIS — N3021 Other chronic cystitis with hematuria: Secondary | ICD-10-CM | POA: Diagnosis not present

## 2018-04-02 ENCOUNTER — Ambulatory Visit (HOSPITAL_COMMUNITY)
Admission: RE | Admit: 2018-04-02 | Discharge: 2018-04-02 | Disposition: A | Discharge status: Home / Self Care | Payer: Medicare Other | Source: Ambulatory Visit | Attending: Gastroenterology | Admitting: Gastroenterology

## 2018-04-02 ENCOUNTER — Encounter (HOSPITAL_COMMUNITY)
Admission: RE | Disposition: A | Discharge status: Home / Self Care | Payer: Self-pay | Source: Ambulatory Visit | Attending: Gastroenterology

## 2018-04-02 ENCOUNTER — Encounter (HOSPITAL_COMMUNITY): Payer: Self-pay | Admitting: *Deleted

## 2018-04-02 ENCOUNTER — Other Ambulatory Visit: Payer: Self-pay

## 2018-04-02 DIAGNOSIS — K5730 Diverticulosis of large intestine without perforation or abscess without bleeding: Secondary | ICD-10-CM | POA: Insufficient documentation

## 2018-04-02 DIAGNOSIS — Z860100 Personal history of colon polyps, unspecified: Secondary | ICD-10-CM

## 2018-04-02 DIAGNOSIS — Z79899 Other long term (current) drug therapy: Secondary | ICD-10-CM | POA: Diagnosis not present

## 2018-04-02 DIAGNOSIS — D122 Benign neoplasm of ascending colon: Secondary | ICD-10-CM | POA: Diagnosis not present

## 2018-04-02 DIAGNOSIS — Z09 Encounter for follow-up examination after completed treatment for conditions other than malignant neoplasm: Secondary | ICD-10-CM | POA: Insufficient documentation

## 2018-04-02 DIAGNOSIS — Z791 Long term (current) use of non-steroidal anti-inflammatories (NSAID): Secondary | ICD-10-CM | POA: Diagnosis not present

## 2018-04-02 DIAGNOSIS — K648 Other hemorrhoids: Secondary | ICD-10-CM | POA: Diagnosis not present

## 2018-04-02 DIAGNOSIS — Z8673 Personal history of transient ischemic attack (TIA), and cerebral infarction without residual deficits: Secondary | ICD-10-CM | POA: Insufficient documentation

## 2018-04-02 DIAGNOSIS — E785 Hyperlipidemia, unspecified: Secondary | ICD-10-CM | POA: Diagnosis not present

## 2018-04-02 HISTORY — PX: POLYPECTOMY: SHX5525

## 2018-04-02 HISTORY — DX: Unspecified convulsions: R56.9

## 2018-04-02 HISTORY — PX: COLONOSCOPY: SHX5424

## 2018-04-02 SURGERY — COLONOSCOPY
Anesthesia: Moderate Sedation

## 2018-04-02 MED ORDER — MEPERIDINE HCL 100 MG/ML IJ SOLN
INTRAMUSCULAR | Status: AC
  Filled 2018-04-02: qty 1

## 2018-04-02 MED ORDER — SODIUM CHLORIDE 0.9 % IV SOLN
INTRAVENOUS | Status: DC
  Administered 2018-04-02: 1000 mL via INTRAVENOUS

## 2018-04-02 MED ORDER — STERILE WATER FOR IRRIGATION IR SOLN
Status: DC | PRN
  Administered 2018-04-02: 10:00:00

## 2018-04-02 MED ORDER — MEPERIDINE HCL 100 MG/ML IJ SOLN
INTRAMUSCULAR | Status: DC | PRN
  Administered 2018-04-02 (×2): 25 mg

## 2018-04-02 MED ORDER — MIDAZOLAM HCL 5 MG/5ML IJ SOLN
INTRAMUSCULAR | Status: AC
  Filled 2018-04-02: qty 5

## 2018-04-02 MED ORDER — MIDAZOLAM HCL 5 MG/5ML IJ SOLN
INTRAMUSCULAR | Status: DC | PRN
  Administered 2018-04-02 (×2): 2 mg via INTRAVENOUS

## 2018-04-02 NOTE — H&P (Addendum)
Primary Care Physician:  Asencion Noble, MD Primary Gastroenterologist:  Dr. Oneida Alar  Pre-Procedure History & Physical: HPI:  Kristina [name] is a 73 y.o. female here for  PERSONAL HISTORY OF POLYPS.  Past Medical History:  Diagnosis Date  . Hyperlipidemia   . Seizure ([place])    2 "silent seizures"per patient 8 years apart  . TIA (transient ischemic attack)     Past Surgical History:  Procedure Laterality Date  . COLONOSCOPY  12/22/2006   [facility]:  8 mm sigmoid colon polyp removed via snare cautery/ Otherwise no masses, inflammatory changes, diverticula , no internal hemorrhoids. Path-tubulovillous adenomas  . COLONOSCOPY  03/16/2012   Procedure: COLONOSCOPY;  Surgeon: Danie Binder, MD;  Location: [facility];  Service: Endoscopy;  Laterality: N/A;  10:00-changed to 11:00 Darius Bump to notifiy pt  . EYE SURGERY Bilateral    cataract  . TONSILLECTOMY      Prior to Admission medications   Medication Sig Start Date End Date Taking? Authorizing Provider  amLODipine (NORVASC) 2.5 MG tablet Take 2.5 mg by mouth daily.  09/21/17  Yes [provider]  CRESTOR 10 MG tablet Take 10 mg by mouth every evening.  12/17/11  Yes [provider]  famotidine (PEPCID) 10 MG tablet Take 10 mg by mouth daily.   Yes [provider]  loratadine (CLARITIN) 10 MG tablet Take 10 mg by mouth daily as needed for allergies.   Yes [provider]  meloxicam (MOBIC) 15 MG tablet Take 15 mg by mouth every evening.   Yes [provider]  Multiple Vitamin (MULTIVITAMIN WITH MINERALS) TABS tablet Take 1 tablet by mouth daily.   Yes [provider]  Na Sulfate-K Sulfate-Mg Sulf (SUPREP BOWEL PREP KIT) 17.5-3.13-1.6 GM/177ML SOLN Take 1 kit by mouth as directed. 12/14/17  Yes Mahala Menghini, PA-C  Omega-3 Fatty Acids (FISH OIL) 1000 MG CAPS Take 1,000 mg by mouth daily.   Yes [provider]  sodium chloride (OCEAN) 0.65 % nasal spray Place 1 spray into the nose  as needed for congestion.    Yes [provider]  acetaminophen (TYLENOL) 500 MG tablet Take 1,000 mg by mouth daily as needed for moderate pain or headache.    [provider]    Allergies as of 12/14/2017 - Review Complete 12/14/2017  Allergen Reaction Noted  . Lactose intolerance (gi) Other (See Comments) 05/18/2012    Family History  Problem Relation Age of Onset  . Lung cancer Father   . Colon polyps Father   . Colon cancer Other        ?maternal grandmother may have had colon cancer    Social History   Socioeconomic History  . Marital status: Married    Spouse name: Not on file  . Number of children: 2  . Years of education: Not on file  . Highest education level: Not on file  Occupational History  . Occupation: [employer]  Social Needs  . Financial resource strain: Not on file  . Food insecurity:    Worry: Not on file    Inability: Not on file  . Transportation needs:    Medical: Not on file    Non-medical: Not on file  Tobacco Use  . Smoking status: Never Smoker  . Smokeless tobacco: Never Used  Substance and Sexual Activity  . Alcohol use: No  . Drug use: No  . Sexual activity: Not on file  Lifestyle  . Physical activity:    Days per  week: Not on file    Minutes per session: Not on file  . Stress: Not on file  Relationships  . Social connections:    Talks on phone: Not on file    Gets together: Not on file    Attends religious service: Not on file    Active member of club or organization: Not on file    Attends meetings of clubs or organizations: Not on file    Relationship status: Not on file  . Intimate partner violence:    Fear of current or ex partner: Not on file    Emotionally abused: Not on file    Physically abused: Not on file    Forced sexual activity: Not on file  Other Topics Concern  . Not on file  Social History Narrative  . Not on file    Review of Systems: See HPI, otherwise negative ROS   Physical  Exam: BP 134/80   Pulse 76   Temp 99.1 F (37.3 C) (Oral)   Resp 19   Ht 5' 2.5" (1.588 m)   Wt 83.9 kg   SpO2 97%   BMI 33.30 kg/m  General:   Alert,  pleasant and cooperative in NAD Head:  Normocephalic and atraumatic. Neck:  Supple; Lungs:  Clear throughout to auscultation.    Heart:  Regular rate and rhythm. Abdomen:  Soft, nontender and nondistended. Normal bowel sounds, without guarding, and without rebound.   Neurologic:  Alert and  oriented x4;  grossly normal neurologically.  Impression/Plan:      PERSONAL HISTORY OF POLYPS.  Plan:  1. [facility] TODAY DISCUSSED PROCEDURE, BENEFITS, & RISKS: < 1% chance of medication reaction, bleeding, perforation, or rupture of spleen/liver.

## 2018-04-02 NOTE — Op Note (Signed)
[facility] Patient Name: Kristina [name] Procedure Date: 04/02/2018 9:37 AM MRN: [id number] Date of Birth: Oct 13, 1945 Attending MD: Barney Drain MD, MD CSN: [id number] Age: 73 Admit Type: Outpatient Procedure:                Colonoscopy WITH SNARE CAUTERY POLYPECTOMY Indications:              Personal history of colonic polyps Providers:                Barney Drain MD, MD, Janeece Riggers, RN, Rosina Lowenstein,                            RN Referring MD:             Asencion Noble Medicines:                Propofol per Anesthesia Complications:            No immediate complications. Estimated Blood Loss:     Estimated blood loss was minimal. Procedure:                Pre-Anesthesia Assessment:                           - Prior to the procedure, a History and Physical                            was performed, and patient medications and                            allergies were reviewed. The patient's tolerance of                            previous anesthesia was also reviewed. The risks                            and benefits of the procedure and the sedation                            options and risks were discussed with the patient.                            All questions were answered, and informed consent                            was obtained. Prior Anticoagulants: The patient has                            taken previous NSAID medication, last dose was 2                            days prior to procedure. ASA Grade Assessment: II -                            A patient with mild systemic disease. After  reviewing the risks and benefits, the patient was                            deemed in satisfactory condition to undergo the                            procedure. After obtaining informed consent, the                            colonoscope was passed under direct vision.                            Throughout the procedure, the patient's blood            pressure, pulse, and oxygen saturations were                            monitored continuously. The [id number]-[id number] ([id number])                            scope was introduced through the anus and advanced                            to the the cecum, identified by appendiceal orifice                            and ileocecal valve. The colonoscopy was somewhat                            difficult due to a tortuous colon. Successful                            completion of the procedure was aided by                            straightening and shortening the scope to obtain                            bowel loop reduction and COLOWRAP. The patient                            tolerated the procedure well. The quality of the                            bowel preparation was good. The ileocecal valve,                            appendiceal orifice, and rectum were photographed. Scope In: 10:07:38 AM Scope Out: 10:22:45 AM Scope Withdrawal Time: 0 hours 13 minutes 0 seconds  Total Procedure Duration: 0 hours 15 minutes 7 seconds  Findings:      Two sessile polyps were found in the proximal ascending colon. The       polyps were 3 to 5 mm in size. These polyps were removed with a cold  snare. Resection and retrieval were complete.      Multiple small and large-mouthed diverticula were found in the       recto-sigmoid colon, sigmoid colon and ascending colon.      Internal hemorrhoids were found. The hemorrhoids were small. Impression:               - Two 3 to 5 mm polyps in the proximal ascending                            colon, removed with a cold snare. Resected and                            retrieved.                           - Diverticulosis in the recto-sigmoid colon, in the                            sigmoid colon and in the ascending colon.                           - Internal hemorrhoids. Moderate Sedation:      Moderate (conscious) sedation was administered by the endoscopy  nurse       and supervised by the endoscopist. The following parameters were       monitored: oxygen saturation, heart rate, blood pressure, and response       to care. Total physician intraservice time was 29 minutes. Recommendation:           - Patient has a contact number available for                            emergencies. The signs and symptoms of potential                            delayed complications were discussed with the                            patient. Return to normal activities tomorrow.                            Written discharge instructions were provided to the                            patient.                           - High fiber diet.                           - Continue present medications.                           - Await pathology results.                           - Repeat colonoscopy in 5-10 years for surveillance. Procedure  Code(s):        --- Professional ---                           [phone], Colonoscopy, flexible; with removal of                            tumor(s), polyp(s), or other lesion(s) by snare                            technique                           99153, Moderate sedation; each additional 15                            minutes intraservice time                           G0500, Moderate sedation services provided by the                            same physician or other qualified health care                            professional performing a gastrointestinal                            endoscopic service that sedation supports,                            requiring the presence of an independent trained                            observer to assist in the monitoring of the                            patient's level of consciousness and physiological                            status; initial 15 minutes of intra-service time;                            patient age 45 years or older (additional time may                            be  reported with [id number], as appropriate) Diagnosis Code(s):        --- Professional ---                           D12.2, Benign neoplasm of ascending colon                           K64.8, Other hemorrhoids  Z86.010, Personal history of colonic polyps                           K57.30, Diverticulosis of large intestine without                            perforation or abscess without bleeding CPT copyright 2018 American [employer] Association. All rights reserved. The codes documented in this report are preliminary and upon coder review may  be revised to meet current compliance requirements. Barney Drain, MD Barney Drain MD, MD 04/02/2018 10:46:03 AM This report has been signed electronically. Number of Addenda: 0

## 2018-04-02 NOTE — Discharge Instructions (Signed)
You have small internal hemorrhoids and diverticulosis IN YOUR LEFT AND RIGHT COLON. YOU HAD ONE SMALL POLYP REMOVED.    DRINK WATER TO KEEP YOUR URINE LIGHT YELLOW.  CONTINUE YOUR WEIGHT LOSS EFFORTS. YOUR BODY MASS INDEX IS OVER 30 WHICH MEANS YOU ARE OBESE. OBESITY IS ASSOCIATED WITH AN INCREASE FOR ALL CANCERS, INCLUDING ESOPHAGEAL AND COLON CANCER. A WEIGHT OF 165 LBS OR LESS WILL GET YOUR BODY MASS INDEX(BMI) UNDER 30.  FOLLOW A HIGH FIBER DIET. AVOID ITEMS THAT CAUSE BLOATING. See info below.  YOUR BIOPSY RESULTS WILL BE BACK IN 5 BUSINESS DAYS.  USE PREPARATION H FOUR TIMES  A DAY IF NEEDED TO RELIEVE RECTAL PAIN/PRESSURE/BLEEDING.  Next colonoscopy in 5-10 years.  Colonoscopy Care After Read the instructions outlined below and refer to this sheet in the next week. These discharge instructions provide you with general information on caring for yourself after you leave the hospital. While your treatment has been planned according to the most current medical practices available, unavoidable complications occasionally occur. If you have any problems or questions after discharge, call DR. Lyle Leisner, [phone].  ACTIVITY  You may resume your regular activity, but move at a slower pace for the next 24 hours.   Take frequent rest periods for the next 24 hours.   Walking will help get rid of the air and reduce the bloated feeling in your belly (abdomen).   No driving for 24 hours (because of the medicine (anesthesia) used during the test).   You may shower.   Do not sign any important legal documents or operate any machinery for 24 hours (because of the anesthesia used during the test).    NUTRITION  Drink plenty of fluids.   You may resume your normal diet as instructed by your doctor.   Begin with a light meal and progress to your normal diet. Heavy or fried foods are harder to digest and may make you feel sick to your stomach (nauseated).   Avoid alcoholic beverages for  24 hours or as instructed.    MEDICATIONS  You may resume your normal medications.   WHAT YOU CAN EXPECT TODAY  Some feelings of bloating in the abdomen.   Passage of more gas than usual.   Spotting of blood in your stool or on the toilet paper  .  IF YOU HAD POLYPS REMOVED DURING THE COLONOSCOPY:  Eat a soft diet IF YOU HAVE NAUSEA, BLOATING, ABDOMINAL PAIN, OR VOMITING.    FINDING OUT THE RESULTS OF YOUR TEST Not all test results are available during your visit. DR. Oneida Alar WILL CALL YOU WITHIN 14 DAYS OF YOUR PROCEDUE WITH YOUR RESULTS. Do not assume everything is normal if you have not heard from DR. Shawnese Magner, CALL HER OFFICE AT [phone].  SEEK IMMEDIATE MEDICAL ATTENTION AND CALL THE OFFICE: [phone] IF:  You have more than a spotting of blood in your stool.   Your belly is swollen (abdominal distention).   You are nauseated or vomiting.   You have a temperature over 101F.   You have abdominal pain or discomfort that is severe or gets worse throughout the day.  High-Fiber Diet A high-fiber diet changes your normal diet to include more whole grains, legumes, fruits, and vegetables. Changes in the diet involve replacing refined carbohydrates with unrefined foods. The calorie level of the diet is essentially unchanged. The Dietary Reference Intake (recommended amount) for adult males is 38 grams per day. For adult females, it is 25 grams per day. Pregnant and  lactating women should consume 28 grams of fiber per day. Fiber is the intact part of a plant that is not broken down during digestion. Functional fiber is fiber that has been isolated from the plant to provide a beneficial effect in the body.  PURPOSE  Increase stool bulk.   Ease and regulate bowel movements.   Lower cholesterol.   REDUCE RISK OF COLON CANCER  INDICATIONS THAT YOU NEED MORE FIBER  Constipation and hemorrhoids.   Uncomplicated diverticulosis (intestine condition) and irritable bowel  syndrome.   Weight management.   As a protective measure against hardening of the arteries (atherosclerosis), diabetes, and cancer.   GUIDELINES FOR INCREASING FIBER IN THE DIET  Start adding fiber to the diet slowly. A gradual increase of about 5 more grams (2 slices of whole-wheat bread, 2 servings of most fruits or vegetables, or 1 bowl of high-fiber cereal) per day is best. Too rapid an increase in fiber may result in constipation, flatulence, and bloating.   Drink enough water and fluids to keep your urine clear or pale yellow. Water, juice, or caffeine-free drinks are recommended. Not drinking enough fluid may cause constipation.   Eat a variety of high-fiber foods rather than one type of fiber.   Try to increase your intake of fiber through using high-fiber foods rather than fiber pills or supplements that contain small amounts of fiber.   The goal is to change the types of food eaten. Do not supplement your present diet with high-fiber foods, but replace foods in your present diet.   INCLUDE A VARIETY OF FIBER SOURCES  Replace refined and processed grains with whole grains, canned fruits with fresh fruits, and incorporate other fiber sources. White rice, white breads, and most bakery goods contain little or no fiber.   Brown whole-grain rice, buckwheat oats, and many fruits and vegetables are all good sources of fiber. These include: broccoli, Brussels sprouts, cabbage, cauliflower, beets, sweet potatoes, white potatoes (skin on), carrots, tomatoes, eggplant, squash, berries, fresh fruits, and dried fruits.   Cereals appear to be the richest source of fiber. Cereal fiber is found in whole grains and bran. Bran is the fiber-rich outer coat of cereal grain, which is largely removed in refining. In whole-grain cereals, the bran remains. In breakfast cereals, the largest amount of fiber is found in those with "bran" in their names. The fiber content is sometimes indicated on the label.     You may need to include additional fruits and vegetables each day.   In baking, for 1 cup white flour, you may use the following substitutions:   1 cup whole-wheat flour minus 2 tablespoons.   1/2 cup white flour plus 1/2 cup whole-wheat flour.   Polyps, Colon  A polyp is extra tissue that grows inside your body. Colon polyps grow in the large intestine. The large intestine, also called the colon, is part of your digestive system. It is a long, hollow tube at the end of your digestive tract where your body makes and stores stool. Most polyps are not dangerous. They are benign. This means they are not cancerous. But over time, some types of polyps can turn into cancer. Polyps that are smaller than a pea are usually not harmful. But larger polyps could someday become or may already be cancerous. To be safe, doctors remove all polyps and test them.   PREVENTION There is not one sure way to prevent polyps. You might be able to lower your risk of getting them if  you:  Eat more fruits and vegetables and less fatty food.   Do not smoke.   Avoid alcohol.   Exercise every day.   Lose weight if you are overweight.   Eating more calcium and folate can also lower your risk of getting polyps. Some foods that are rich in calcium are milk, cheese, and broccoli. Some foods that are rich in folate are chickpeas, kidney beans, and spinach.    Diverticulosis Diverticulosis is a common condition that develops when small pouches (diverticula) form in the wall of the colon. The risk of diverticulosis increases with age. It happens more often in people who eat a low-fiber diet. Most individuals with diverticulosis have no symptoms. Those individuals with symptoms usually experience belly (abdominal) pain, constipation, or loose stools (diarrhea).  HOME CARE INSTRUCTIONS  Increase the amount of fiber in your diet as directed by your caregiver or dietician. This may reduce symptoms of diverticulosis.    Drink at least 6 to 8 glasses of water each day to prevent constipation.   Try not to strain when you have a bowel movement.   Avoiding nuts and seeds to prevent complications is NOT NECESSARY.   FOODS HAVING HIGH FIBER CONTENT INCLUDE:  Fruits. Apple, peach, pear, tangerine, raisins, prunes.   Vegetables. Brussels sprouts, asparagus, broccoli, cabbage, carrot, cauliflower, romaine lettuce, spinach, summer squash, tomato, winter squash, zucchini.   Starchy Vegetables. Baked beans, kidney beans, lima beans, split peas, lentils, potatoes (with skin).   Grains. Whole wheat bread, brown rice, bran flake cereal, plain oatmeal, white rice, shredded wheat, bran muffins.   SEEK IMMEDIATE MEDICAL CARE IF:  You develop increasing pain or severe bloating.   You have an oral temperature above 101F.   You develop vomiting or bowel movements that are bloody or black.

## 2018-04-05 ENCOUNTER — Telehealth: Payer: Self-pay | Admitting: Gastroenterology

## 2018-04-05 NOTE — Telephone Encounter (Signed)
Please call pt. She had A SERRATED adenoma removed.  FOLLOW A HIGH FIBER DIET. We do not routinely screen for polyps after the age of 81 BUT YOU COULD CONSIDER  THE BENEFITS V. RISKS OF A COLONOSCOPY IN 5 YEARS.

## 2018-04-05 NOTE — Telephone Encounter (Signed)
Reminder in epic °

## 2018-04-06 NOTE — Telephone Encounter (Signed)
PT is aware.

## 2018-04-07 ENCOUNTER — Encounter (HOSPITAL_COMMUNITY): Payer: Self-pay | Admitting: Gastroenterology

## 2018-09-02 DIAGNOSIS — I10 Essential (primary) hypertension: Secondary | ICD-10-CM | POA: Diagnosis not present

## 2018-09-02 DIAGNOSIS — M1991 Primary osteoarthritis, unspecified site: Secondary | ICD-10-CM | POA: Diagnosis not present

## 2018-10-18 DIAGNOSIS — L259 Unspecified contact dermatitis, unspecified cause: Secondary | ICD-10-CM | POA: Diagnosis not present

## 2018-12-10 ENCOUNTER — Ambulatory Visit
Admission: EM | Admit: 2018-12-10 | Discharge: 2018-12-10 | Disposition: A | Discharge status: Home / Self Care | Payer: Medicare Other | Attending: Emergency Medicine | Admitting: Emergency Medicine

## 2018-12-10 ENCOUNTER — Other Ambulatory Visit: Payer: Self-pay

## 2018-12-10 DIAGNOSIS — R300 Dysuria: Secondary | ICD-10-CM

## 2018-12-10 DIAGNOSIS — R3915 Urgency of urination: Secondary | ICD-10-CM

## 2018-12-10 DIAGNOSIS — R350 Frequency of micturition: Secondary | ICD-10-CM | POA: Diagnosis not present

## 2018-12-10 LAB — POCT URINALYSIS DIP (MANUAL ENTRY)
Glucose, UA: 250 mg/dL — AB
Nitrite, UA: POSITIVE — AB
Protein Ur, POC: 100 mg/dL — AB
Spec Grav, UA: 1.015 (ref 1.010–1.025)
Urobilinogen, UA: 4 E.U./dL — AB
pH, UA: 5 (ref 5.0–8.0)

## 2018-12-10 MED ORDER — CEPHALEXIN 500 MG PO CAPS: 500.0000 mg | ORAL_CAPSULE | Freq: Two times a day (BID) | ORAL | 0 refills | Status: AC

## 2018-12-10 NOTE — ED Provider Notes (Addendum)
[facility]   CC: "UTI"  SUBJECTIVE:  Kristina [name] is a 73 y.o. female who complains of urinary frequency, dysuria, and lower abdominal pressure for the past 3 weeks.  Patient denies a precipitating event, recent sexual encounter, excessive caffeine intake. Denies abdominal or flank pain.  Has tried OTC AZO with minimal relief.  Symptoms are made worse with urination.  Reports frequent hx of UTIs in the past.  Is followed by a urologist.  Denies fever, chills, nausea, vomiting, abdominal pain, flank pain, abnormal vaginal discharge or bleeding, hematuria.    LMP: No LMP recorded. Patient is postmenopausal.  ROS: As in HPI.  All other pertinent ROS negative.     Past Medical History:  Diagnosis Date  . Hyperlipidemia   . 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 TIA (transient ischemic attack)    Past Surgical History:  Procedure Laterality Date  . COLONOSCOPY  12/22/2006   SLF:  8 mm sigmoid colon polyp removed via snare cautery/ Otherwise no masses, inflammatory changes, diverticula , no internal hemorrhoids. Path-tubulovillous adenomas  . COLONOSCOPY  03/16/2012   Procedure: COLONOSCOPY;  Surgeon: Danie Binder, MD;  Location: [facility];  Service: Endoscopy;  Laterality: N/A;  10:00-changed to 11:00 Darius Bump to notifiy pt  . COLONOSCOPY N/A 04/02/2018   Procedure: COLONOSCOPY;  Surgeon: Danie Binder, MD;  Location: [facility];  Service: Endoscopy;  Laterality: N/A;  9:30  . EYE SURGERY Bilateral    cataract  . POLYPECTOMY  04/02/2018   Procedure: POLYPECTOMY;  Surgeon: Danie Binder, MD;  Location: [facility];  Service: Endoscopy;;  . TONSILLECTOMY     Allergies  Allergen Reactions  . Lactose Intolerance (Gi) Other (See Comments)    Upset Stomach    No current facility-administered medications on file prior to encounter.    Current Outpatient Medications on File Prior to Encounter  Medication Sig Dispense Refill  .  acetaminophen (TYLENOL) 500 MG tablet Take 1,000 mg by mouth daily as needed for moderate pain or headache.    Marland Kitchen amLODipine (NORVASC) 2.5 MG tablet Take 2.5 mg by mouth daily.     . CRESTOR 10 MG tablet Take 10 mg by mouth every evening.     . famotidine (PEPCID) 10 MG tablet Take 10 mg by mouth daily.    Marland Kitchen loratadine (CLARITIN) 10 MG tablet Take 10 mg by mouth daily as needed for allergies.    . meloxicam (MOBIC) 15 MG tablet Take 15 mg by mouth every evening.    . Multiple Vitamin (MULTIVITAMIN WITH MINERALS) TABS tablet Take 1 tablet by mouth daily.    . Omega-3 Fatty Acids (FISH OIL) 1000 MG CAPS Take 1,000 mg by mouth daily.    . sodium chloride (OCEAN) 0.65 % nasal spray Place 1 spray into the nose as needed for congestion.      Social History   Socioeconomic History  . Marital status: Married    Spouse name: Not on file  . Number of children: 2  . Years of education: Not on file  . Highest education level: Not on file  Occupational History  . Occupation: [employer]  Social Needs  . Financial resource strain: Not on file  . Food insecurity    Worry: Not on file    Inability: Not on file  . Transportation needs    Medical: Not on file    Non-medical: Not on file  Tobacco Use  .  Smoking status: Never Smoker  . Smokeless tobacco: Never Used  Substance and Sexual Activity  . Alcohol use: No  . Drug use: No  . Sexual activity: Not on file  Lifestyle  . Physical activity    Days per week: Not on file    Minutes per session: Not on file  . Stress: Not on file  Relationships  . Social Herbalist on phone: Not on file    Gets together: Not on file    Attends religious service: Not on file    Active member of club or organization: Not on file    Attends meetings of clubs or organizations: Not on file    Relationship status: Not on file  . Intimate partner violence    Fear of current or ex partner: Not on file    Emotionally abused: Not on file    Physically  abused: Not on file    Forced sexual activity: Not on file  Other Topics Concern  . Not on file  Social History Narrative  . Not on file   Family History  Problem Relation Age of Onset  . Lung cancer Father   . Colon polyps Father   . Colon cancer Other        ?maternal grandmother may have had colon cancer    OBJECTIVE:  Vitals:   12/10/18 1414  BP: 139/78  Pulse: 78  Resp: 20  Temp: 98.1 F (36.7 C)  SpO2: 93%   General appearance: Alert in no acute distress HEENT: NCAT.  Oropharynx clear.  Lungs: clear to auscultation bilaterally without adventitious breath sounds Heart: regular rate and rhythm.   Abdomen: soft; non-distended; no tenderness; bowel sounds present; no guarding Back: no CVA tenderness Extremities: no edema; symmetrical with no gross deformities Skin: warm and dry Neurologic: Ambulates from chair to exam table without difficulty Psychological: alert and cooperative; normal mood and affect  Labs Reviewed  POCT URINALYSIS DIP (MANUAL ENTRY) - Abnormal; Notable for the following components:      Result Value   Color, UA orange (*)    Clarity, UA cloudy (*)    Glucose, UA =250 (*)    Bilirubin, UA small (*)    Ketones, POC UA small (15) (*)    Blood, UA trace-intact (*)    Protein Ur, POC =100 (*)    Urobilinogen, UA 4.0 (*)    Nitrite, UA Positive (*)    Leukocytes, UA Large (3+) (*)    All other components within normal limits    ASSESSMENT & PLAN:  1. Urinary frequency   2. Dysuria   3. Urinary urgency     Meds ordered this encounter  Medications  . cephALEXin (KEFLEX) 500 MG capsule    Sig: Take 1 capsule (500 mg total) by mouth 2 (two) times daily for 7 days.    Dispense:  14 capsule    Refill:  0    Order Specific Question:   Supervising Provider    Answer:   Raylene Everts [id number]   Unable to produce enough urine to do accurate urine analysis or to send out to culture Based symptoms and hx of frequent UTIs we will treat   Push fluids and get plenty of rest.   Take antibiotic as directed and to completion Continue with AZO as needed for urinary frequency, urgency and burning Follow up with PCP in the next 1-2 weeks for recheck to ensure symptoms are improving Return here or go  to ER if you have any new or worsening symptoms such as fever, worsening abdominal pain, nausea/vomiting, flank pain, etc...  Outlined signs and symptoms indicating need for more acute intervention. Patient verbalized understanding. After Visit Summary given.     Lestine Box, PA-C 12/10/18 [place], [place], PA-C 12/10/18 1437

## 2018-12-10 NOTE — ED Triage Notes (Signed)
Pt has h/o frequent utis and has been seen by urologist for same. Pt unable to be seen by pcp or urologist. Pt reports frequency and pressure   For past 3 weeks

## 2018-12-10 NOTE — Discharge Instructions (Signed)
Unable to produce enough urine to do urine analysis or to send out to culture Based symptoms and hx of frequent UTIs we will treat  Push fluids and get plenty of rest.   Take antibiotic as directed and to completion Continue with AZO as needed for urinary frequency, urgency and burning Follow up with PCP in the next 1-2 weeks for recheck to ensure symptoms are improving Return here or go to ER if you have any new or worsening symptoms such as fever, worsening abdominal pain, nausea/vomiting, flank pain, etc..Marland Kitchen

## 2018-12-27 DIAGNOSIS — Z23 Encounter for immunization: Secondary | ICD-10-CM | POA: Diagnosis not present

## 2019-01-05 ENCOUNTER — Ambulatory Visit (INDEPENDENT_AMBULATORY_CARE_PROVIDER_SITE_OTHER): Payer: Medicare Other | Admitting: Urology

## 2019-01-05 DIAGNOSIS — N952 Postmenopausal atrophic vaginitis: Secondary | ICD-10-CM | POA: Diagnosis not present

## 2019-01-05 DIAGNOSIS — N3021 Other chronic cystitis with hematuria: Secondary | ICD-10-CM

## 2019-03-14 DIAGNOSIS — M1990 Unspecified osteoarthritis, unspecified site: Secondary | ICD-10-CM | POA: Diagnosis not present

## 2019-03-14 DIAGNOSIS — E785 Hyperlipidemia, unspecified: Secondary | ICD-10-CM | POA: Diagnosis not present

## 2019-03-14 DIAGNOSIS — I10 Essential (primary) hypertension: Secondary | ICD-10-CM | POA: Diagnosis not present

## 2019-03-18 DIAGNOSIS — M1990 Unspecified osteoarthritis, unspecified site: Secondary | ICD-10-CM | POA: Diagnosis not present

## 2019-03-18 DIAGNOSIS — I10 Essential (primary) hypertension: Secondary | ICD-10-CM | POA: Diagnosis not present

## 2019-03-18 DIAGNOSIS — E785 Hyperlipidemia, unspecified: Secondary | ICD-10-CM | POA: Diagnosis not present

## 2019-03-18 DIAGNOSIS — Z79899 Other long term (current) drug therapy: Secondary | ICD-10-CM | POA: Diagnosis not present

## 2019-03-25 DIAGNOSIS — Z23 Encounter for immunization: Secondary | ICD-10-CM | POA: Diagnosis not present

## 2019-04-25 DIAGNOSIS — Z23 Encounter for immunization: Secondary | ICD-10-CM | POA: Diagnosis not present

## 2019-07-19 ENCOUNTER — Other Ambulatory Visit: Payer: Self-pay

## 2019-07-19 ENCOUNTER — Telehealth: Payer: Self-pay | Admitting: Urology

## 2019-07-19 DIAGNOSIS — N952 Postmenopausal atrophic vaginitis: Secondary | ICD-10-CM

## 2019-07-19 MED ORDER — PREMARIN 0.625 MG/GM VA CREA: 1.0000 | TOPICAL_CREAM | VAGINAL | 1 refills | Status: AC

## 2019-07-19 NOTE — Telephone Encounter (Signed)
Refill submitted. 

## 2019-07-19 NOTE — Telephone Encounter (Signed)
Pt requests refill on premarin to [facility] [place]

## 2019-08-09 ENCOUNTER — Other Ambulatory Visit (HOSPITAL_COMMUNITY): Payer: Self-pay | Admitting: Internal Medicine

## 2019-08-09 DIAGNOSIS — Z1231 Encounter for screening mammogram for malignant neoplasm of breast: Secondary | ICD-10-CM

## 2019-08-12 ENCOUNTER — Ambulatory Visit (HOSPITAL_COMMUNITY)
Admission: RE | Admit: 2019-08-12 | Discharge: 2019-08-12 | Disposition: A | Discharge status: Home / Self Care | Payer: Medicare Other | Source: Ambulatory Visit | Attending: Internal Medicine | Admitting: Internal Medicine

## 2019-08-12 ENCOUNTER — Other Ambulatory Visit: Payer: Self-pay

## 2019-08-12 DIAGNOSIS — Z1231 Encounter for screening mammogram for malignant neoplasm of breast: Secondary | ICD-10-CM | POA: Diagnosis not present

## 2019-09-01 ENCOUNTER — Other Ambulatory Visit (HOSPITAL_COMMUNITY): Payer: Self-pay | Admitting: Internal Medicine

## 2019-09-01 DIAGNOSIS — Z780 Asymptomatic menopausal state: Secondary | ICD-10-CM

## 2019-09-12 ENCOUNTER — Other Ambulatory Visit: Payer: Self-pay

## 2019-09-12 ENCOUNTER — Ambulatory Visit (HOSPITAL_COMMUNITY)
Admission: RE | Admit: 2019-09-12 | Discharge: 2019-09-12 | Disposition: A | Discharge status: Home / Self Care | Payer: Medicare Other | Source: Ambulatory Visit | Attending: Internal Medicine | Admitting: Internal Medicine

## 2019-09-12 DIAGNOSIS — Z780 Asymptomatic menopausal state: Secondary | ICD-10-CM | POA: Diagnosis not present

## 2019-09-12 DIAGNOSIS — M8589 Other specified disorders of bone density and structure, multiple sites: Secondary | ICD-10-CM | POA: Diagnosis not present

## 2019-09-27 DIAGNOSIS — H26493 Other secondary cataract, bilateral: Secondary | ICD-10-CM | POA: Diagnosis not present

## 2019-09-27 DIAGNOSIS — H43813 Vitreous degeneration, bilateral: Secondary | ICD-10-CM | POA: Diagnosis not present

## 2019-09-27 DIAGNOSIS — H33301 Unspecified retinal break, right eye: Secondary | ICD-10-CM | POA: Diagnosis not present

## 2019-09-27 DIAGNOSIS — H524 Presbyopia: Secondary | ICD-10-CM | POA: Diagnosis not present

## 2019-10-19 DIAGNOSIS — Z1283 Encounter for screening for malignant neoplasm of skin: Secondary | ICD-10-CM | POA: Diagnosis not present

## 2019-10-19 DIAGNOSIS — D1801 Hemangioma of skin and subcutaneous tissue: Secondary | ICD-10-CM | POA: Diagnosis not present

## 2019-10-19 DIAGNOSIS — L821 Other seborrheic keratosis: Secondary | ICD-10-CM | POA: Diagnosis not present

## 2019-10-19 DIAGNOSIS — D229 Melanocytic nevi, unspecified: Secondary | ICD-10-CM | POA: Diagnosis not present

## 2019-12-01 DIAGNOSIS — H26492 Other secondary cataract, left eye: Secondary | ICD-10-CM | POA: Diagnosis not present

## 2020-01-02 DIAGNOSIS — Z23 Encounter for immunization: Secondary | ICD-10-CM | POA: Diagnosis not present

## 2020-01-12 DIAGNOSIS — H26491 Other secondary cataract, right eye: Secondary | ICD-10-CM | POA: Diagnosis not present

## 2020-01-19 DIAGNOSIS — Z23 Encounter for immunization: Secondary | ICD-10-CM | POA: Diagnosis not present

## 2020-02-06 ENCOUNTER — Emergency Department (HOSPITAL_COMMUNITY): Payer: Medicare Other

## 2020-02-06 ENCOUNTER — Observation Stay (HOSPITAL_COMMUNITY): Payer: Medicare Other

## 2020-02-06 ENCOUNTER — Encounter (HOSPITAL_COMMUNITY): Payer: Self-pay | Admitting: Emergency Medicine

## 2020-02-06 ENCOUNTER — Observation Stay (HOSPITAL_COMMUNITY)
Admission: EM | Admit: 2020-02-06 | Discharge: 2020-02-07 | Disposition: A | Discharge status: Home / Self Care | Payer: Medicare Other | Attending: Family Medicine | Admitting: Family Medicine

## 2020-02-06 ENCOUNTER — Other Ambulatory Visit: Payer: Self-pay

## 2020-02-06 DIAGNOSIS — R410 Disorientation, unspecified: Secondary | ICD-10-CM | POA: Diagnosis not present

## 2020-02-06 DIAGNOSIS — G9341 Metabolic encephalopathy: Secondary | ICD-10-CM | POA: Diagnosis not present

## 2020-02-06 DIAGNOSIS — G459 Transient cerebral ischemic attack, unspecified: Secondary | ICD-10-CM | POA: Diagnosis not present

## 2020-02-06 DIAGNOSIS — Z79899 Other long term (current) drug therapy: Secondary | ICD-10-CM | POA: Diagnosis not present

## 2020-02-06 DIAGNOSIS — G9340 Encephalopathy, unspecified: Secondary | ICD-10-CM | POA: Diagnosis not present

## 2020-02-06 DIAGNOSIS — R569 Unspecified convulsions: Secondary | ICD-10-CM

## 2020-02-06 DIAGNOSIS — I10 Essential (primary) hypertension: Secondary | ICD-10-CM | POA: Diagnosis not present

## 2020-02-06 DIAGNOSIS — Z20822 Contact with and (suspected) exposure to covid-19: Secondary | ICD-10-CM | POA: Diagnosis not present

## 2020-02-06 DIAGNOSIS — M6281 Muscle weakness (generalized): Secondary | ICD-10-CM | POA: Insufficient documentation

## 2020-02-06 DIAGNOSIS — I639 Cerebral infarction, unspecified: Secondary | ICD-10-CM

## 2020-02-06 DIAGNOSIS — E785 Hyperlipidemia, unspecified: Secondary | ICD-10-CM | POA: Insufficient documentation

## 2020-02-06 DIAGNOSIS — R29898 Other symptoms and signs involving the musculoskeletal system: Secondary | ICD-10-CM | POA: Diagnosis not present

## 2020-02-06 DIAGNOSIS — G4489 Other headache syndrome: Secondary | ICD-10-CM | POA: Diagnosis not present

## 2020-02-06 DIAGNOSIS — R29818 Other symptoms and signs involving the nervous system: Secondary | ICD-10-CM | POA: Diagnosis not present

## 2020-02-06 DIAGNOSIS — R4182 Altered mental status, unspecified: Secondary | ICD-10-CM | POA: Diagnosis present

## 2020-02-06 DIAGNOSIS — Z8673 Personal history of transient ischemic attack (TIA), and cerebral infarction without residual deficits: Secondary | ICD-10-CM | POA: Diagnosis not present

## 2020-02-06 DIAGNOSIS — I6521 Occlusion and stenosis of right carotid artery: Secondary | ICD-10-CM | POA: Diagnosis not present

## 2020-02-06 LAB — COMPREHENSIVE METABOLIC PANEL
ALT: 21 U/L (ref 0–44)
AST: 26 U/L (ref 15–41)
Albumin: 4.1 g/dL (ref 3.5–5.0)
Alkaline Phosphatase: 74 U/L (ref 38–126)
Anion gap: 11 (ref 5–15)
BUN: 16 mg/dL (ref 8–23)
CO2: 24 mmol/L (ref 22–32)
Calcium: 9.2 mg/dL (ref 8.9–10.3)
Chloride: 103 mmol/L (ref 98–111)
Creatinine, Ser: 0.79 mg/dL (ref 0.44–1.00)
GFR, Estimated: >60 mL/min (ref >60)
Glucose, Bld: 142 mg/dL — ABNORMAL HIGH (ref 70–99)
Potassium: 3.8 mmol/L (ref 3.5–5.1)
Sodium: 138 mmol/L (ref 135–145)
Total Bilirubin: 0.7 mg/dL (ref 0.3–1.2)
Total Protein: 7.1 g/dL (ref 6.5–8.1)

## 2020-02-06 LAB — RESP PANEL BY RT-PCR (FLU A&B, COVID) ARPGX2
Influenza A by PCR: NEGATIVE
Influenza B by PCR: NEGATIVE
SARS Coronavirus 2 by RT PCR: NEGATIVE

## 2020-02-06 LAB — CBC
HCT: 45 % (ref 36.0–46.0)
Hemoglobin: 15.4 g/dL — ABNORMAL HIGH (ref 12.0–15.0)
MCH: 31.9 pg (ref 26.0–34.0)
MCHC: 34.2 g/dL (ref 30.0–36.0)
MCV: 93.2 fL (ref 80.0–100.0)
Platelets: 268 10*3/uL (ref 150–400)
RBC: 4.83 MIL/uL (ref 3.87–5.11)
RDW: 13.2 % (ref 11.5–15.5)
WBC: 7.2 10*3/uL (ref 4.0–10.5)
nRBC: 0 % (ref 0.0–0.2)

## 2020-02-06 LAB — DIFFERENTIAL
Abs Immature Granulocytes: 0.02 10*3/uL (ref 0.00–0.07)
Basophils Absolute: 0 10*3/uL (ref 0.0–0.1)
Basophils Relative: 1 %
Eosinophils Absolute: 0 10*3/uL (ref 0.0–0.5)
Eosinophils Relative: 0 %
Immature Granulocytes: 0 %
Lymphocytes Relative: 16 %
Lymphs Abs: 1.1 10*3/uL (ref 0.7–4.0)
Monocytes Absolute: 0.3 10*3/uL (ref 0.1–1.0)
Monocytes Relative: 5 %
Neutro Abs: 5.7 10*3/uL (ref 1.7–7.7)
Neutrophils Relative %: 78 %

## 2020-02-06 LAB — PROTIME-INR
INR: 0.9 (ref 0.8–1.2)
Prothrombin Time: 11.9 seconds (ref 11.4–15.2)

## 2020-02-06 LAB — RAPID URINE DRUG SCREEN, HOSP PERFORMED
Amphetamines: NOT DETECTED
Barbiturates: NOT DETECTED
Benzodiazepines: NOT DETECTED
Cocaine: NOT DETECTED
Opiates: NOT DETECTED
Tetrahydrocannabinol: NOT DETECTED

## 2020-02-06 LAB — ETHANOL: Alcohol, Ethyl (B): <10 mg/dL (ref <10)

## 2020-02-06 LAB — APTT: aPTT: 25 seconds (ref 24–36)

## 2020-02-06 MED ORDER — ASPIRIN 300 MG RE SUPP: 300.0000 mg | Freq: Every day | RECTAL | Status: DC

## 2020-02-06 MED ORDER — SALINE SPRAY 0.65 % NA SOLN: 1.0000 | NASAL | Status: DC | PRN

## 2020-02-06 MED ORDER — MELOXICAM 7.5 MG PO TABS
15.0000 mg | ORAL_TABLET | Freq: Every evening | ORAL | Status: DC
  Administered 2020-02-07: 15 mg via ORAL
  Filled 2020-02-06 (×2): qty 2

## 2020-02-06 MED ORDER — SENNOSIDES-DOCUSATE SODIUM 8.6-50 MG PO TABS: 1.0000 | ORAL_TABLET | Freq: Every evening | ORAL | Status: DC | PRN

## 2020-02-06 MED ORDER — LORATADINE 10 MG PO TABS
10.0000 mg | ORAL_TABLET | Freq: Every day | ORAL | Status: DC
  Administered 2020-02-06 – 2020-02-07 (×2): 10 mg via ORAL
  Filled 2020-02-06 (×2): qty 1

## 2020-02-06 MED ORDER — STROKE: EARLY STAGES OF RECOVERY BOOK: Freq: Once | Status: AC

## 2020-02-06 MED ORDER — SODIUM CHLORIDE 0.9% FLUSH
3.0000 mL | Freq: Once | INTRAVENOUS | Status: AC
Start: 2020-02-06 — End: 2020-02-06
  Administered 2020-02-06: 3 mL via INTRAVENOUS

## 2020-02-06 MED ORDER — ACETAMINOPHEN 650 MG RE SUPP: 650.0000 mg | RECTAL | Status: DC | PRN

## 2020-02-06 MED ORDER — FAMOTIDINE 20 MG PO TABS
10.0000 mg | ORAL_TABLET | Freq: Every day | ORAL | Status: DC
  Administered 2020-02-06 – 2020-02-07 (×2): 10 mg via ORAL
  Filled 2020-02-06 (×2): qty 1

## 2020-02-06 MED ORDER — ROSUVASTATIN CALCIUM 10 MG PO TABS
10.0000 mg | ORAL_TABLET | Freq: Every evening | ORAL | Status: DC
  Administered 2020-02-06 – 2020-02-07 (×2): 10 mg via ORAL
  Filled 2020-02-06 (×2): qty 1

## 2020-02-06 MED ORDER — ACETAMINOPHEN 325 MG PO TABS
650.0000 mg | ORAL_TABLET | ORAL | Status: DC | PRN
  Administered 2020-02-06 – 2020-02-07 (×4): 650 mg via ORAL
  Filled 2020-02-06 (×4): qty 2

## 2020-02-06 MED ORDER — ACETAMINOPHEN 160 MG/5ML PO SOLN: 650.0000 mg | ORAL | Status: DC | PRN

## 2020-02-06 MED ORDER — ASPIRIN 325 MG PO TABS
325.0000 mg | ORAL_TABLET | Freq: Every day | ORAL | Status: DC
  Administered 2020-02-06: 325 mg via ORAL
  Filled 2020-02-06: qty 1

## 2020-02-06 MED ORDER — ENOXAPARIN SODIUM 40 MG/0.4ML ~~LOC~~ SOLN
40.0000 mg | SUBCUTANEOUS | Status: DC
  Administered 2020-02-06 – 2020-02-07 (×2): 40 mg via SUBCUTANEOUS
  Filled 2020-02-06 (×2): qty 0.4

## 2020-02-06 MED ORDER — SODIUM CHLORIDE 0.9 % IV SOLN: INTRAVENOUS | Status: DC

## 2020-02-06 NOTE — ED Notes (Signed)

## 2020-02-06 NOTE — ED Notes (Signed)
This RN to bedside to introduce self to patient and family. Found patient to be away for testing. This RN educated husband at bedside of current plan of care. When educated on the need for a COVID test, pt's husband reports "I just want you to know that I am following the explain provided by our president. It has been proven that this mask and the little one that you have don't do anything. My daughter is a respiratory therapist and I can tell you that who ever is writing your policies isn't following that lead by our president." Pt educated on policy and that a mask must be worn while staff is present and moving through the campus. Pt replies "If you ask me nicely, I will do it. But I will not do it because I am being 'mandated' too." Pt educated that facility policy will be followed, or family will have to leave the facility. At this time, family in agreement.

## 2020-02-06 NOTE — ED Triage Notes (Signed)
Pt reports around 0930 she began to get confused and have a headache. Pt with Hx of TIA. Dr. Rogene Houston with patient at this time.

## 2020-02-06 NOTE — Consult Note (Signed)
[facility] TELEMEDICINE  CONSULT   Date of service: February 06, 2020 Patient Name: Kristina [name] MRN:  [id number] DOB:  [birth year] Requesting Provider: Dr. Fredia Sorrow Location of the provider: [facility] Neurohospitalist office Location of the patient: [facility]. Reason for consult: "Stroke code"   This consult was provided via telemedicine with 2-way video and audio communication. The patient/family was informed that care would be provided in this way and agreed to receive care in this manner.    History of Present Illness  Kristina [name] is a 74 y.o. female with PMH significant for hx of HLD, prior seizure with last seizure several years ago, hx of TIA who presents with an episode of confusion with headache and poor recollection of what happened today. Symptoms have resolved and now she is back to her baseline. Patient reports going to the bathroom this AM and was tired. Does note recall what else, feels lethargic.  Reports that last time she was confused, they found that she had a TIA.  Stroke Measures   Last Known Well: 2300 on 02/06/20. TPA Given: No, resolution of symptoms. IR Thrombectomy: No mRS: Modified Rankin Scale: 2-Slight disability-UNABLE to perform all activities but does not need assistance Time of teleneurologist evaluation: 12:00 on 02/06/20.   Vitals   Vitals:   02/06/20 1158 02/06/20 1207  BP:  (!) 150/67  Pulse:  (!) 59  Resp:  17  Temp:  98.6 F (37 C)  TempSrc:  Oral  SpO2:  99%  Weight: 82.7 kg   Height: 5\' 4"  (1.626 m)      Body mass index is 31.29 kg/m.   Tele-neuro Exam and NIHSS   General: awake, alert, no distress.  1A: Level of Consciousness - 0 1B: Ask Month and Age - 1 1C: 'Blink Eyes' & 'Squeeze Hands' - 0 2: Test Horizontal Extraocular Movements - 0 3: Test Visual Fields - 0 4: Test Facial Palsy - 0 5A: Test Left Arm Motor Drift - 0 5B: Test Right Arm Motor Drift - 0 6A: Test Left Leg Motor Drift  - 0 6B: Test Right Leg Motor Drift - 0 7: Test Limb Ataxia - 0 8: Test Sensation - 0 9: Test Language/Aphasia- 0 10: Test Dysarthria - 0 11: Test Extinction/Inattention - 0 NIHSS score: 1  Other exam findings: None  Imaging and Labs   CBC: No results for input(s): WBC, NEUTROABS, HGB, HCT, MCV, PLT in the last 168 hours.  Basic Metabolic Panel:  Lab Results  Component Value Date   NA 139 05/18/2012   K 4.4 05/18/2012   CO2 26 05/17/2012   GLUCOSE 122 (H) 05/18/2012   BUN 24 (H) 05/18/2012   CREATININE 0.69 05/18/2012   CALCIUM 9.4 05/17/2012   GFRNONAA 89 (L) 05/18/2012   GFRAA >90 05/18/2012   Lipid Panel:  Lab Results  Component Value Date   LDLCALC 162 (H) 05/18/2012   HgbA1c:  Lab Results  Component Value Date   HGBA1C 6.0 (H) 05/18/2012   Urine Drug Screen:     Component Value Date/Time   LABOPIA NONE DETECTED 05/18/2012 0852   COCAINSCRNUR NONE DETECTED 05/18/2012 0852   LABBENZ NONE DETECTED 05/18/2012 0852   AMPHETMU NONE DETECTED 05/18/2012 0852   THCU NONE DETECTED 05/18/2012 0852   LABBARB NONE DETECTED 05/18/2012 0852    Alcohol Level No results found for: [facility]  Results for orders placed during the hospital encounter of 02/06/20  CT HEAD CODE STROKE WO CONTRAST`  Narrative CLINICAL DATA:  Code stroke.  Neuro deficit, acute, stroke suspected  EXAM: CT HEAD WITHOUT CONTRAST  TECHNIQUE: Contiguous axial images were obtained from the base of the skull through the vertex without intravenous contrast.  COMPARISON:  05/18/2012 MRI head and prior. 05/18/2012 head CT and prior.  FINDINGS: Brain: No acute infarct or intracranial hemorrhage. No mass lesion. No midline shift, ventriculomegaly or extra-axial fluid collection. Minimal chronic microvascular ischemic changes.  Vascular: No hyperdense vessel or unexpected calcification. Minimal carotid siphon atherosclerotic calcifications.  Skull: Negative for fracture or focal  lesion.  Sinuses/Orbits: Normal orbits. Clear paranasal sinuses. No mastoid effusion.  Other: None.  ASPECTS [facility] Stroke Program Early CT Score)  - Ganglionic level infarction (caudate, lentiform nuclei, internal capsule, insula, M1-M3 cortex): 7  - Supraganglionic infarction (M4-M6 cortex): 3  Total score (0-10 with 10 being normal): Is 10  IMPRESSION: 1. No evidence of acute intracranial abnormality. 2. ASPECTS is 10.  Code stroke imaging results were communicated on 02/06/2020 at 11:59 am to provider Dr. Rogene Houston via telephone, who verbally acknowledged these results.   Electronically Signed By: Primitivo Gauze M.D. On: 02/06/2020 12:00     Impression   Kristina [name] is a 74 y.o. female with PMH significant for HLD, prior seizure with last seizure several years ago, hx of TIA who presents with an episode of confusion with headache and poor recollection of what happened today.. Her limited tele-neurologic examination is notable for unable to tell me how old she is but no focal deficit.  The episode seems more consistent with either a seizure in sleep with post ictal confusion vs toxic metabolic encephalopathy.  Given resolution of symptoms, unlikely she would benefit from a STAT CT angio head and neck.  Recommendations  - MRI Brain without contrast - routine EEG - Routine Neurology consult. ______________________________________________________________________  This patient is receiving care for possible acute neurological changes. There was 30 minutes of care by this provider at the time of service, including time for direct evaluation via telemedicine, review of medical records, imaging studies and discussion of findings with providers, the patient and/or family.  Donnetta Simpers [facility] Pager Number [phone]  If 7pm- 7am, please page neurology on call as listed in [place].

## 2020-02-06 NOTE — H&P (Signed)
History and Physical  Kristina [name] DHR:[id number] DOB: [birth year] DOA: 02/06/2020   PCP: Asencion Noble, MD   Patient coming from: Home  Chief Complaint: altered mental status  HPI:  Kristina [name] is a 74 y.o. female with medical history of hypertension, hyperlipidemia, seizure disorder, TIA presenting with altered mental status.  According to the patient and spouse, the patient was last known normal around 9:30 AM on the morning of 02/06/2020.  He noted the patient to be walking back and forth in the house aimlessly around that time.  Subsequently, they sat down to eat some breakfast around 10 AM.  Apparently, the patient was just staring at her bowl of grits for a few minutes and not speaking.  After about 5 to 10 minutes, the patient did speak and was able to answer some of his questions.  He did not notice any dysarthria, visual disturbance, or focal motor deficit.  The patient herself did not remember any of the events from this morning until she arrived to the emergency department.  Her last recollection was being in the emergency department room.  She complained of a headache that is bifrontal in nature.  She had denied any prodromal symptoms or aura.  She denied any hallucinations.  She denied any recent fever, chills, chest pain, shortness breath, coughing, hemoptysis, nausea, vomiting, diarrhea, abdominal pain, dysuria, hematuria. She states that she had a similar episode about 8 years prior to this admission.  At the time of my evaluation in the emergency department, the patient stated that she was back to normal which is validated by the patient's spouse at the bedside.  She has not been start any new medications. In the emergency department, the patient was afebrile hemodynamically stable with oxygen saturation 94% on room air.  BMP, LFTs, and CBC were essentially unremarkable.  CT of the brain was negative.  Teleneurology was consulted and recommended inpatient admission for further  neurologic work-up.  Neurology, Dr. Merlene Laughter, was consulted to assist with management.  In the ED, patient and spouse feel that patient is back at baseline.  Assessment/Plan: Acute encephalopathy, type unspecified -Suspect complicated migraine -Neurology Consult -PT/OT evaluation -Speech therapy eval -CT brain--neg -MRI brain--neg -MRA brain--N/A -Carotid Duplex-- -Echo-- -LDL-- -HbA1C-- -Antiplatelet--ASA 325 mg -EEG  Essential hypertension -Holding amlodipine to allow permissive hypertension  Hyperlipidemia -Continue Crestor            Past Medical History:  Diagnosis Date  . Hyperlipidemia   . Seizure ([place])    2 "silent seizures"per patient 8 years apart  . TIA (transient ischemic attack)    Past Surgical History:  Procedure Laterality Date  . COLONOSCOPY  12/22/2006   SLF:  8 mm sigmoid colon polyp removed via snare cautery/ Otherwise no masses, inflammatory changes, diverticula , no internal hemorrhoids. Path-tubulovillous adenomas  . COLONOSCOPY  03/16/2012   Procedure: COLONOSCOPY;  Surgeon: Danie Binder, MD;  Location: [facility];  Service: Endoscopy;  Laterality: N/A;  10:00-changed to 11:00 Darius Bump to notifiy pt  . COLONOSCOPY N/A 04/02/2018   Procedure: COLONOSCOPY;  Surgeon: Danie Binder, MD;  Location: [facility];  Service: Endoscopy;  Laterality: N/A;  9:30  . EYE SURGERY Bilateral    cataract  . POLYPECTOMY  04/02/2018   Procedure: POLYPECTOMY;  Surgeon: Danie Binder, MD;  Location: [facility];  Service: Endoscopy;;  . TONSILLECTOMY     Social History:  reports that she has never smoked. She has  never used smokeless tobacco. She reports that she does not drink alcohol and does not use drugs.   Family History  Problem Relation Age of Onset  . Lung cancer Father   . Colon polyps Father   . Colon cancer Other        ?maternal grandmother may have had colon cancer     Allergies  Allergen Reactions  . Lactose Intolerance  (Gi) Other (See Comments)    Upset Stomach      Prior to Admission medications   Medication Sig Start Date End Date Taking? Authorizing Provider  levocetirizine (XYZAL) 5 MG tablet Take 5 mg by mouth daily. 01/11/20  Yes [provider]  acetaminophen (TYLENOL) 500 MG tablet Take 1,000 mg by mouth daily as needed for moderate pain or headache.    [provider]  amLODipine (NORVASC) 2.5 MG tablet Take 2.5 mg by mouth daily.  09/21/17   [provider]  conjugated estrogens (PREMARIN) vaginal cream Place 1 Applicatorful vaginally every other day. 07/19/19   McKenzie, Candee Furbish, MD  CRESTOR 10 MG tablet Take 10 mg by mouth every evening.  12/17/11   [provider]  famotidine (PEPCID) 10 MG tablet Take 10 mg by mouth daily.    [provider]  meloxicam (MOBIC) 15 MG tablet Take 15 mg by mouth every evening.    [provider]  Multiple Vitamin (MULTIVITAMIN WITH MINERALS) TABS tablet Take 1 tablet by mouth daily.    [provider]  Omega-3 Fatty Acids (FISH OIL) 1000 MG CAPS Take 1,000 mg by mouth daily.    [provider]  sodium chloride (OCEAN) 0.65 % nasal spray Place 1 spray into the nose as needed for congestion.     [provider]    Review of Systems:  Constitutional:  No weight loss, night sweats, Fevers, chills, fatigue.  Head&Eyes: No headache.  No vision loss.  No eye pain or scotoma ENT:  No Difficulty swallowing,Tooth/dental problems,Sore throat,  No ear ache, post nasal drip,  Cardio-vascular:  No chest pain, Orthopnea, PND, swelling in lower extremities,  dizziness, palpitations  GI:  No  abdominal pain, nausea, vomiting, diarrhea, loss of appetite, hematochezia, melena, heartburn, indigestion, Resp:  No shortness of breath with exertion or at rest. No cough. No coughing up of blood .No wheezing.No chest wall deformity  Skin:  no rash or lesions.  GU:  no dysuria, change in color of  urine, no urgency or frequency. No flank pain.  Musculoskeletal:  No joint pain or swelling. No decreased range of motion. No back pain.  Psych:  No change in mood or affect. No depression or anxiety. Neurologic:  no dysesthesia, no focal weakness, no vision loss. No syncope  Physical Exam: Vitals:   02/06/20 1207 02/06/20 1215 02/06/20 1230 02/06/20 1245  BP: (!) 150/67 (!) 159/70 (!) 142/68 (!) 159/63  Pulse: (!) 59 (!) 59 (!) 58 (!) 59  Resp: 17 16 19 18   Temp: 98.6 F (37 C)     TempSrc: Oral     SpO2: 99% 96% 94% 93%  Weight:      Height:       General:  A&O x 3, NAD, nontoxic, pleasant/cooperative Head/Eye: No conjunctival hemorrhage, no icterus, [place]/AT, No nystagmus ENT:  No icterus,  No thrush, good dentition, no pharyngeal exudate Neck:  No masses, no lymphadenpathy, no bruits CV:  RRR, no rub, no gallop, no S3 Lung:  CTAB, good air movement, no wheeze, no  rhonchi Abdomen: soft/NT, +BS, nondistended, no peritoneal signs Ext: No cyanosis, No rashes, No petechiae, No lymphangitis, No edema Neuro: CNII-XII intact, strength 4/5 in bilateral upper and lower extremities, no dysmetria  Labs on Admission:  Basic Metabolic Panel: Recent Labs  Lab 02/06/20 1148  NA 138  K 3.8  CL 103  CO2 24  GLUCOSE 142*  BUN 16  CREATININE 0.79  CALCIUM 9.2   Liver Function Tests: Recent Labs  Lab 02/06/20 1148  AST 26  ALT 21  ALKPHOS 74  BILITOT 0.7  PROT 7.1  ALBUMIN 4.1   No results for input(s): LIPASE, AMYLASE in the last 168 hours. No results for input(s): AMMONIA in the last 168 hours. CBC: Recent Labs  Lab 02/06/20 1148  WBC 7.2  NEUTROABS 5.7  HGB 15.4*  HCT 45.0  MCV 93.2  PLT 268   Coagulation Profile: Recent Labs  Lab 02/06/20 1148  INR 0.9   Cardiac Enzymes: No results for input(s): CKTOTAL, CKMB, CKMBINDEX, TROPONINI in the last 168 hours. BNP: Invalid input(s): POCBNP CBG: No results for input(s): GLUCAP in the last 168 hours. Urine  analysis:    Component Value Date/Time   BILIRUBINUR small (A) 12/10/2018 1425   KETONESUR small (15) (A) 12/10/2018 1425   PROTEINUR =100 (A) 12/10/2018 1425   UROBILINOGEN 4.0 (A) 12/10/2018 1425   NITRITE Positive (A) 12/10/2018 1425   LEUKOCYTESUR Large (3+) (A) 12/10/2018 1425   Sepsis Labs: @LABRCNTIP (procalcitonin:4,lacticidven:4) )No results found for this or any previous visit (from the past 240 hour(s)).   Radiological Exams on Admission: MR Brain Wo Contrast (neuro protocol)  Result Date: 02/06/2020 CLINICAL DATA:  Transient ischemic attack (TIA) Mental status change, unknown cause EXAM: MRI HEAD WITHOUT CONTRAST TECHNIQUE: Multiplanar, multiecho pulse sequences of the brain and surrounding structures were obtained without intravenous contrast. COMPARISON:  02/06/2020 head CT and prior.  05/18/2012 MRI head. FINDINGS: Brain: No diffusion-weighted signal abnormality. No intracranial hemorrhage. No midline shift, ventriculomegaly or extra-axial fluid collection. No mass lesion. Mild chronic microvascular ischemic changes. Vascular: Normal flow voids. Skull and upper cervical spine: Normal marrow signal. Sinuses/Orbits: Sequela of bilateral lens replacement. Pneumatized paranasal sinuses mastoid air cells. Other: None. IMPRESSION: 1. No acute intracranial process. 2. Mild chronic microvascular ischemic changes. Electronically Signed   By: Primitivo Gauze M.D.   On: 02/06/2020 13:48   CT HEAD CODE STROKE WO CONTRAST`  Result Date: 02/06/2020 CLINICAL DATA:  Code stroke.  Neuro deficit, acute, stroke suspected EXAM: CT HEAD WITHOUT CONTRAST TECHNIQUE: Contiguous axial images were obtained from the base of the skull through the vertex without intravenous contrast. COMPARISON:  05/18/2012 MRI head and prior. 05/18/2012 head CT and prior. FINDINGS: Brain: No acute infarct or intracranial hemorrhage. No mass lesion. No midline shift, ventriculomegaly or extra-axial fluid collection.  Minimal chronic microvascular ischemic changes. Vascular: No hyperdense vessel or unexpected calcification. Minimal carotid siphon atherosclerotic calcifications. Skull: Negative for fracture or focal lesion. Sinuses/Orbits: Normal orbits. Clear paranasal sinuses. No mastoid effusion. Other: None. ASPECTS [facility] Stroke Program Early CT Score) - Ganglionic level infarction (caudate, lentiform nuclei, internal capsule, insula, M1-M3 cortex): 7 - Supraganglionic infarction (M4-M6 cortex): 3 Total score (0-10 with 10 being normal): Is 10 IMPRESSION: 1. No evidence of acute intracranial abnormality. 2. ASPECTS is 10. Code stroke imaging results were communicated on 02/06/2020 at 11:59 am to provider Dr. Rogene Houston via telephone, who verbally acknowledged these results. Electronically Signed   By: Primitivo Gauze M.D.   On: 02/06/2020 12:00  Time spent:60 minutes Code Status:   FULL Family Communication:  spouse at bedside Disposition Plan: expect 1 day hospitalization Consults called: neurology DVT Prophylaxis: [place] Lovenox  Orson Eva, DO  [facility] Pager [phone]  If 7PM-7AM, please contact night-coverage [email or web address] Password [email or web address] 02/06/2020, 2:03 PM

## 2020-02-06 NOTE — Consult Note (Signed)
[place] A. Merlene Laughter, MD     [email or web address]          Kristina [name] is an 74 y.o. female.   ASSESSMENT/PLAN: 1.  Transient spell of altered mental status/acute confusion of unclear etiology.  Given the semiology, I suspect this is most likely another episode of complex partial seizure.  She has had these spells in the past but quite infrequent and therefore long-term antiepileptic medications may not be warranted.  Repeat EEG is suggested.  The spell possibly could be due to migraines but she does not have frequent episodic headaches.  Ischemic events are unlikely.     The patient is 74 year old white female who presents with the acute onset of altered mental status characterized by staring and unresponsiveness. It appears that the spell lasted for about 5-10 minutes. She is amnestic to the event. There apparently is a are remote history of similar events about 8 years ago.  The patient reports she is back at baseline.  She does report having post ictal frontal headaches.  The spells are very similar to previous event she had in the past.  She does not report having focal numbness, weakness, dysarthria or chest pain.  No palpitations noted.  The review of systems otherwise negative.    GENERAL: This is a pleasant overweight female who is doing well at this time.  HEENT: Neck is supple no trauma noted.  ABDOMEN: soft  EXTREMITIES: No edema   BACK: Normal  SKIN: Normal by inspection.    MENTAL STATUS: Alert and oriented. Speech, language and cognition are generally intact. Judgment and insight normal.   CRANIAL NERVES: Pupils are equal, round and reactive to light and accomodation; extra ocular movements are full, there is no significant nystagmus; visual fields are full; upper and lower facial muscles are normal in strength and symmetric, there is no flattening of the nasolabial folds; tongue is midline; uvula is midline; shoulder elevation is  normal.  MOTOR: Normal tone, bulk and strength; no pronator drift.  COORDINATION: Left finger to nose is normal, right finger to nose is normal, No rest tremor; no intention tremor; no postural tremor; no bradykinesia.  REFLEXES: Deep tendon reflexes are symmetrical and normal. Plantar reflexes are flexor bilaterally.   SENSATION: Normal to light touch, temperature, and pain.       Blood pressure (!) 144/88, pulse 67, temperature 98.5 F (36.9 C), temperature source Oral, resp. rate 18, height 5\' 4"  (1.626 m), weight 82.7 kg, SpO2 97 %.  Past Medical History:  Diagnosis Date  . Hyperlipidemia   . Seizure ([place])    2 "silent seizures"per patient 8 years apart  . TIA (transient ischemic attack)     Past Surgical History:  Procedure Laterality Date  . COLONOSCOPY  12/22/2006   SLF:  8 mm sigmoid colon polyp removed via snare cautery/ Otherwise no masses, inflammatory changes, diverticula , no internal hemorrhoids. Path-tubulovillous adenomas  . COLONOSCOPY  03/16/2012   Procedure: COLONOSCOPY;  Surgeon: Danie Binder, MD;  [facility];  Service: Endoscopy;  Laterality: N/A;  10:00-changed to 11:00 Darius Bump to notifiy pt  . COLONOSCOPY N/A 04/02/2018   Procedure: COLONOSCOPY;  Surgeon: Danie Binder, MD;  [facility];  Service: Endoscopy;  Laterality: N/A;  9:30  . EYE SURGERY Bilateral    cataract  . POLYPECTOMY  04/02/2018   Procedure: POLYPECTOMY;  Surgeon: Danie Binder, MD;  [facility];  Service: Endoscopy;;  . TONSILLECTOMY  Family History  Problem Relation Age of Onset  . Lung cancer Father   . Colon polyps Father   . Colon cancer Other        ?maternal grandmother may have had colon cancer    Social History:  reports that she has never smoked. She has never used smokeless tobacco. She reports that she does not drink alcohol and does not use drugs.  Allergies:  Allergies  Allergen Reactions  . Lactose Intolerance (Gi)  Other (See Comments)    Upset Stomach     Medications: Prior to Admission medications   Medication Sig Start Date End Date Taking? Authorizing Provider  levocetirizine (XYZAL) 5 MG tablet Take 5 mg by mouth daily. 01/11/20  Yes [provider]  acetaminophen (TYLENOL) 500 MG tablet Take 1,000 mg by mouth daily as needed for moderate pain or headache.    [provider]  amLODipine (NORVASC) 2.5 MG tablet Take 2.5 mg by mouth daily.  09/21/17   [provider]  conjugated estrogens (PREMARIN) vaginal cream Place 1 Applicatorful vaginally every other day. 07/19/19   McKenzie, Candee Furbish, MD  CRESTOR 10 MG tablet Take 10 mg by mouth every evening.  12/17/11   [provider]  famotidine (PEPCID) 10 MG tablet Take 10 mg by mouth daily.    [provider]  meloxicam (MOBIC) 15 MG tablet Take 15 mg by mouth every evening.    [provider]  Multiple Vitamin (MULTIVITAMIN WITH MINERALS) TABS tablet Take 1 tablet by mouth daily.    [provider]  Omega-3 Fatty Acids (FISH OIL) 1000 MG CAPS Take 1,000 mg by mouth daily.    [provider]  sodium chloride (OCEAN) 0.65 % nasal spray Place 1 spray into the nose as needed for congestion.     [provider]    Scheduled Meds: . aspirin  300 mg Rectal Daily   Or  . aspirin  325 mg Oral Daily  . enoxaparin (LOVENOX) injection  40 mg Subcutaneous Q24H  . famotidine  10 mg Oral Daily  . loratadine  10 mg Oral Daily  . meloxicam  15 mg Oral QPM  . rosuvastatin  10 mg Oral QPM   Continuous Infusions: . sodium chloride     PRN Meds:.acetaminophen **OR** acetaminophen (TYLENOL) oral liquid 160 mg/5 mL **OR** acetaminophen, senna-docusate, sodium chloride     Results for orders placed or performed during the hospital encounter of 02/06/20 (from the past 48 hour(s))  Protime-INR     Status: None   Collection Time: 02/06/20 11:48 AM  Result Value Ref Range    Prothrombin Time 11.9 11.4 - 15.2 seconds   INR 0.9 0.8 - 1.2    Comment: (NOTE) INR goal varies based on device and disease states. Performed at [facility], [address], [place], [place] [address]   APTT     Status: None   Collection Time: 02/06/20 11:48 AM  Result Value Ref Range   aPTT 25 24 - 36 seconds    Comment: Performed at [facility], [address], [place], [place] [address]  CBC     Status: Abnormal   Collection Time: 02/06/20 11:48 AM  Result Value Ref Range   WBC 7.2 4.0 - 10.5 K/uL   RBC 4.83 3.87 - 5.11 MIL/uL   Hemoglobin 15.4 (H) 12.0 - 15.0 g/dL   HCT 45.0 36 - 46 %   MCV 93.2 80.0 - 100.0 fL   MCH 31.9 26.0 -  34.0 pg   MCHC 34.2 30.0 - 36.0 g/dL   RDW 13.2 11.5 - 15.5 %   Platelets 268 150 - 400 K/uL   nRBC 0.0 0.0 - 0.2 %    Comment: Performed at [facility], [address] [place], [place] [address]  Differential     Status: None   Collection Time: 02/06/20 11:48 AM  Result Value Ref Range   Neutrophils Relative % 78 %   Neutro Abs 5.7 1.7 - 7.7 K/uL   Lymphocytes Relative 16 %   Lymphs Abs 1.1 0.7 - 4.0 K/uL   Monocytes Relative 5 %   Monocytes Absolute 0.3 0.1 - 1.0 K/uL   Eosinophils Relative 0 %   Eosinophils Absolute 0.0 0.0 - 0.5 K/uL   Basophils Relative 1 %   Basophils Absolute 0.0 0.0 - 0.1 K/uL   Immature Granulocytes 0 %   Abs Immature Granulocytes 0.02 0.00 - 0.07 K/uL    Comment: Performed at [facility], [address] [place], [place] [address]  Comprehensive metabolic panel     Status: Abnormal   Collection Time: 02/06/20 11:48 AM  Result Value Ref Range   Sodium 138 135 - 145 mmol/L   Potassium 3.8 3.5 - 5.1 mmol/L   Chloride 103 98 - 111 mmol/L   CO2 24 22 - 32 mmol/L   Glucose, Bld 142 (H) 70 - 99 mg/dL    Comment: Glucose reference range applies only to samples taken after fasting for at least 8 hours.   BUN 16 8 - 23 mg/dL   Creatinine, Ser 0.79 0.44 - 1.00 mg/dL   Calcium 9.2 8.9 - 10.3 mg/dL   Total Protein 7.1  6.5 - 8.1 g/dL   Albumin 4.1 3.5 - 5.0 g/dL   AST 26 15 - 41 U/L   ALT 21 0 - 44 U/L   Alkaline Phosphatase 74 38 - 126 U/L   Total Bilirubin 0.7 0.3 - 1.2 mg/dL   GFR, Estimated >60 >60 mL/min    Comment: (NOTE) Calculated using the CKD-EPI Creatinine Equation (2021)    Anion gap 11 5 - 15    Comment: Performed at [facility], [address], [place], [place] [address]  Ethanol     Status: None   Collection Time: 02/06/20 11:49 AM  Result Value Ref Range   Alcohol, Ethyl (B) <10 <10 mg/dL    Comment: (NOTE) Lowest detectable limit for serum alcohol is 10 mg/dL.  For medical purposes only. Performed at [facility], [address], [place], [place] [address]   Resp Panel by RT-PCR (Flu A&B, Covid) Nasopharyngeal Swab     Status: None   Collection Time: 02/06/20  2:13 PM   Specimen: Nasopharyngeal Swab; Nasopharyngeal(NP) swabs in vial transport medium  Result Value Ref Range   SARS Coronavirus 2 by RT PCR NEGATIVE NEGATIVE    Comment: (NOTE) SARS-CoV-2 target nucleic acids are NOT DETECTED.  The SARS-CoV-2 RNA is generally detectable in upper respiratory specimens during the acute phase of infection. The lowest concentration of SARS-CoV-2 viral copies this assay can detect is 138 copies/mL. A negative result does not preclude SARS-Cov-2 infection and should not be used as the sole basis for treatment or other patient management decisions. A negative result may occur with  improper specimen collection/handling, submission of specimen other than nasopharyngeal swab, presence of viral mutation(s) within the areas targeted by this assay, and inadequate number of viral copies(<138 copies/mL). A negative result must be combined with clinical observations, patient  history, and epidemiological information. The expected result is Negative.  Fact Sheet for Patients:  EntrepreneurPulse.com.au  Fact Sheet for Healthcare Providers:   IncredibleEmployment.be  This test is no t yet approved or cleared by the [place] FDA and  has been authorized for detection and/or diagnosis of SARS-CoV-2 by FDA under an Emergency Use Authorization (EUA). This EUA will remain  in effect (meaning this test can be used) for the duration of the COVID-19 declaration under Section 564(b)(1) of the Act, 21 U.S.C.section 360bbb-3(b)(1), unless the authorization is terminated  or revoked sooner.       Influenza A by PCR NEGATIVE NEGATIVE   Influenza B by PCR NEGATIVE NEGATIVE    Comment: (NOTE) The Xpert Xpress SARS-CoV-2/FLU/RSV plus assay is intended as an aid in the diagnosis of influenza from Nasopharyngeal swab specimens and should not be used as a sole basis for treatment. Nasal washings and aspirates are unacceptable for Xpert Xpress SARS-CoV-2/FLU/RSV testing.  Fact Sheet for Patients: EntrepreneurPulse.com.au  Fact Sheet for Healthcare Providers: IncredibleEmployment.be  This test is not yet approved or cleared by the [place] FDA and has been authorized for detection and/or diagnosis of SARS-CoV-2 by FDA under an Emergency Use Authorization (EUA). This EUA will remain in effect (meaning this test can be used) for the duration of the COVID-19 declaration under Section 564(b)(1) of the Act, 21 U.S.C. section 360bbb-3(b)(1), unless the authorization is terminated or revoked.  Performed at [facility], [address] [place], [place] [address]     Studies/Results:   BRAIN MRI FINDINGS: Brain: No diffusion-weighted signal abnormality. No intracranial hemorrhage. No midline shift, ventriculomegaly or extra-axial fluid collection. No mass lesion. Mild chronic microvascular ischemic changes.  Vascular: Normal flow voids.  Skull and upper cervical spine: Normal marrow signal.  Sinuses/Orbits: Sequela of bilateral lens replacement.  Pneumatized paranasal sinuses mastoid air cells.  Other: None.  IMPRESSION: 1. No acute intracranial process. 2. Mild chronic microvascular ischemic changes.      The brain MRI scan is reviewed in person. No acute changes are noted on DWI. No hemorrhages noted. There is minimal to mild periventricular leukoencephalopathy. No hemorrhages noted. No encephalomalacia.     Alizay Bronkema A. Merlene Laughter, M.D.  Diplomate, Tax adviser of Psychiatry and Neurology ( Neurology). 02/06/2020, 5:57 PM

## 2020-02-06 NOTE — ED Provider Notes (Signed)
[facility] EMERGENCY DEPARTMENT Provider Note   CSN: [id number] Arrival date & time: 02/06/20  1139     History Chief Complaint  Patient presents with  . Code Stroke    Kristina [name] is a 74 y.o. female.  Patient brought in by EMS for concerns for TIA or stroke.  Patient last normal at 930 this morning.  Had a frontal type headache.  And lots of confusion.  Her husband concerns she had a stroke or had a very recurrent TIA.  Has a history of TIAs.  No evidence of any seizures.  But there was some degree of dreaming or perhaps hallucination associated with it.  Husband stated she was very confused there may have been some difficulty with navigating around the house.        Past Medical History:  Diagnosis Date  . Hyperlipidemia   . Seizure ([place])    2 "silent seizures"per patient 8 years apart  . TIA (transient ischemic attack)     Patient Active Problem List   Diagnosis Date Noted  . Personal history of colonic polyps   . Brain TIA 05/18/2012  . Amnesia memory loss 05/18/2012  . Hyperlipidemia 05/18/2012  . Tubulovillous adenoma polyp of colon 02/12/2012    Past Surgical History:  Procedure Laterality Date  . COLONOSCOPY  12/22/2006   SLF:  8 mm sigmoid colon polyp removed via snare cautery/ Otherwise no masses, inflammatory changes, diverticula , no internal hemorrhoids. Path-tubulovillous adenomas  . COLONOSCOPY  03/16/2012   Procedure: COLONOSCOPY;  Surgeon: Danie Binder, MD;  [facility];  Service: Endoscopy;  Laterality: N/A;  10:00-changed to 11:00 Darius Bump to notifiy pt  . COLONOSCOPY N/A 04/02/2018   Procedure: COLONOSCOPY;  Surgeon: Danie Binder, MD;  [facility];  Service: Endoscopy;  Laterality: N/A;  9:30  . EYE SURGERY Bilateral    cataract  . POLYPECTOMY  04/02/2018   Procedure: POLYPECTOMY;  Surgeon: Danie Binder, MD;  [facility];  Service: Endoscopy;;  . TONSILLECTOMY       OB History   No obstetric  history on file.     Family History  Problem Relation Age of Onset  . Lung cancer Father   . Colon polyps Father   . Colon cancer Other        ?maternal grandmother may have had colon cancer    Social History   Tobacco Use  . Smoking status: Never Smoker  . Smokeless tobacco: Never Used  Vaping Use  . Vaping Use: Never used  Substance Use Topics  . Alcohol use: No  . Drug use: No    Home Medications Prior to Admission medications   Medication Sig Start Date End Date Taking? Authorizing Provider  acetaminophen (TYLENOL) 500 MG tablet Take 1,000 mg by mouth daily as needed for moderate pain or headache.    [provider]  amLODipine (NORVASC) 2.5 MG tablet Take 2.5 mg by mouth daily.  09/21/17   [provider]  conjugated estrogens (PREMARIN) vaginal cream Place 1 Applicatorful vaginally every other day. 07/19/19   McKenzie, Candee Furbish, MD  CRESTOR 10 MG tablet Take 10 mg by mouth every evening.  12/17/11   [provider]  famotidine (PEPCID) 10 MG tablet Take 10 mg by mouth daily.    [provider]  loratadine (CLARITIN) 10 MG tablet Take 10 mg by mouth daily as needed for allergies.    [provider]  meloxicam [facility])  15 MG tablet Take 15 mg by mouth every evening.    [provider]  Multiple Vitamin (MULTIVITAMIN WITH MINERALS) TABS tablet Take 1 tablet by mouth daily.    [provider]  Omega-3 Fatty Acids (FISH OIL) 1000 MG CAPS Take 1,000 mg by mouth daily.    [provider]  sodium chloride (OCEAN) 0.65 % nasal spray Place 1 spray into the nose as needed for congestion.     [provider]    Allergies    Lactose intolerance (gi)  Review of Systems   Review of Systems  Constitutional: Negative for chills and fever.  HENT: Negative for congestion, rhinorrhea and sore throat.   Eyes: Negative for visual disturbance.  Respiratory: Negative for cough and shortness of breath.     Cardiovascular: Negative for chest pain and leg swelling.  Gastrointestinal: Negative for abdominal pain, diarrhea, nausea and vomiting.  Genitourinary: Negative for dysuria.  Musculoskeletal: Negative for back pain and neck pain.  Skin: Negative for rash.  Neurological: Positive for headaches. Negative for dizziness and light-headedness.  Hematological: Does not bruise/bleed easily.  Psychiatric/Behavioral: Positive for confusion.    Physical Exam Updated Vital Signs BP (!) 159/63   Pulse (!) 59   Temp 98.6 F (37 C) (Oral)   Resp 18   Ht 1.626 m (5\' 4" )   Wt 82.7 kg   SpO2 93%   BMI 31.29 kg/m   Physical Exam Vitals and nursing note reviewed.  Constitutional:      General: She is not in acute distress.    Appearance: Normal appearance. She is well-developed. She is not ill-appearing.  HENT:     Head: Normocephalic and atraumatic.     Mouth/Throat:     Mouth: Mucous membranes are moist.  Eyes:     Extraocular Movements: Extraocular movements intact.     Conjunctiva/sclera: Conjunctivae normal.     Pupils: Pupils are equal, round, and reactive to light.  Cardiovascular:     Rate and Rhythm: Normal rate and regular rhythm.     Heart sounds: No murmur heard.   Pulmonary:     Effort: Pulmonary effort is normal. No respiratory distress.     Breath sounds: Normal breath sounds.  Abdominal:     Palpations: Abdomen is soft.     Tenderness: There is no abdominal tenderness.  Musculoskeletal:        General: No swelling. Normal range of motion.     Cervical back: Normal range of motion and neck supple. No rigidity.  Skin:    General: Skin is warm and dry.     Capillary Refill: Capillary refill takes less than 2 seconds.  Neurological:     Mental Status: She is alert.     Cranial Nerves: No cranial nerve deficit.     Sensory: No sensory deficit.     Motor: No weakness.     ED Results / Procedures / Treatments   Labs (all labs ordered are listed, but only abnormal  results are displayed) Labs Reviewed  CBC - Abnormal; Notable for the following components:      Result Value   Hemoglobin 15.4 (*)    All other components within normal limits  COMPREHENSIVE METABOLIC PANEL - Abnormal; Notable for the following components:   Glucose, Bld 142 (*)    All other components within normal limits  RESP PANEL BY RT-PCR (FLU A&B, COVID) ARPGX2  PROTIME-INR  APTT  DIFFERENTIAL  ETHANOL  RAPID URINE DRUG SCREEN, HOSP PERFORMED  URINALYSIS, ROUTINE W REFLEX MICROSCOPIC  I-STAT CHEM 8, ED  CBG MONITORING, ED  I-STAT CHEM 8, ED    EKG None  Radiology CT HEAD CODE STROKE WO CONTRAST`  Result Date: 02/06/2020 CLINICAL DATA:  Code stroke.  Neuro deficit, acute, stroke suspected EXAM: CT HEAD WITHOUT CONTRAST TECHNIQUE: Contiguous axial images were obtained from the base of the skull through the vertex without intravenous contrast. COMPARISON:  05/18/2012 MRI head and prior. 05/18/2012 head CT and prior. FINDINGS: Brain: No acute infarct or intracranial hemorrhage. No mass lesion. No midline shift, ventriculomegaly or extra-axial fluid collection. Minimal chronic microvascular ischemic changes. Vascular: No hyperdense vessel or unexpected calcification. Minimal carotid siphon atherosclerotic calcifications. Skull: Negative for fracture or focal lesion. Sinuses/Orbits: Normal orbits. Clear paranasal sinuses. No mastoid effusion. Other: None. ASPECTS [facility] Stroke Program Early CT Score) - Ganglionic level infarction (caudate, lentiform nuclei, internal capsule, insula, M1-M3 cortex): 7 - Supraganglionic infarction (M4-M6 cortex): 3 Total score (0-10 with 10 being normal): Is 10 IMPRESSION: 1. No evidence of acute intracranial abnormality. 2. ASPECTS is 10. Code stroke imaging results were communicated on 02/06/2020 at 11:59 am to provider Dr. Rogene Houston via telephone, who verbally acknowledged these results. Electronically Signed   By: Primitivo Gauze M.D.   On:  02/06/2020 12:00    Procedures Procedures (including critical care time) CRITICAL CARE Performed by: Fredia Sorrow Total critical care time: 45 minutes Critical care time was exclusive of separately billable procedures and treating other patients. Critical care was necessary to treat or prevent imminent or life-threatening deterioration. Critical care was time spent personally by me on the following activities: development of treatment plan with patient and/or surrogate as well as nursing, discussions with consultants, evaluation of patient's response to treatment, examination of patient, obtaining history from patient or surrogate, ordering and performing treatments and interventions, ordering and review of laboratory studies, ordering and review of radiographic studies, pulse oximetry and re-evaluation of patient's condition.   Medications Ordered in ED Medications  sodium chloride flush (NS) 0.9 % injection 3 mL (has no administration in time range)    ED Course  I have reviewed the triage vital signs and the nursing notes.  Pertinent labs & imaging results that were available during my care of the patient were reviewed by me and considered in my medical decision making (see chart for details).    MDM Rules/Calculators/A&P                          Patient last normal 930.  Apparently had a headache and a significant degree of confusion perhaps may be some difficulty navigating.  Husband was concerned that she may have had a stroke or TIA apparently has a history of that in the past.  Head CT without any acute findings.  Patient went came back from CT seem to be improving significantly.  Headache had resolved.  No obvious motor weakness.  No speech difficulties.  So not consistent with a band.  Within the TPA window better symptoms of just the confusion at this point unlikely to be TPA candidate unless things worsen  Discussed with Dr. Merlene Laughter who is in agreement and following  patient in the hospital. MRI has been ordered. Neuro hospitalist telemetry recommended MRI EEG for possible seizure since she has had a history of silent seizures in the past. In addition patient was cleared for admission here  Patient now back to baseline everything is completely normal.   Final Clinical  Impression(s) / ED Diagnoses Final diagnoses:  TIA (transient ischemic attack)  Confusion    Rx / DC Orders ED Discharge Orders    None       Fredia Sorrow, MD 02/06/20 1312

## 2020-02-07 ENCOUNTER — Observation Stay (HOSPITAL_BASED_OUTPATIENT_CLINIC_OR_DEPARTMENT_OTHER): Payer: Medicare Other

## 2020-02-07 ENCOUNTER — Observation Stay (HOSPITAL_COMMUNITY)
Admit: 2020-02-07 | Discharge: 2020-02-07 | Disposition: A | Discharge status: Home / Self Care | Payer: Medicare Other | Attending: Internal Medicine | Admitting: Internal Medicine

## 2020-02-07 DIAGNOSIS — R4182 Altered mental status, unspecified: Secondary | ICD-10-CM

## 2020-02-07 DIAGNOSIS — G459 Transient cerebral ischemic attack, unspecified: Secondary | ICD-10-CM | POA: Diagnosis not present

## 2020-02-07 DIAGNOSIS — G9340 Encephalopathy, unspecified: Secondary | ICD-10-CM | POA: Diagnosis not present

## 2020-02-07 DIAGNOSIS — I361 Nonrheumatic tricuspid (valve) insufficiency: Secondary | ICD-10-CM

## 2020-02-07 LAB — BASIC METABOLIC PANEL
Anion gap: 9 (ref 5–15)
BUN: 17 mg/dL (ref 8–23)
CO2: 25 mmol/L (ref 22–32)
Calcium: 8.6 mg/dL — ABNORMAL LOW (ref 8.9–10.3)
Chloride: 105 mmol/L (ref 98–111)
Creatinine, Ser: 0.68 mg/dL (ref 0.44–1.00)
GFR, Estimated: >60 mL/min (ref >60)
Glucose, Bld: 111 mg/dL — ABNORMAL HIGH (ref 70–99)
Potassium: 3.7 mmol/L (ref 3.5–5.1)
Sodium: 139 mmol/L (ref 135–145)

## 2020-02-07 LAB — URINALYSIS, ROUTINE W REFLEX MICROSCOPIC
Bacteria, UA: NONE SEEN
Bilirubin Urine: NEGATIVE
Glucose, UA: NEGATIVE mg/dL
Hgb urine dipstick: NEGATIVE
Ketones, ur: 20 mg/dL — AB
Nitrite: NEGATIVE
Protein, ur: NEGATIVE mg/dL
Specific Gravity, Urine: 1.013 (ref 1.005–1.030)
pH: 7 (ref 5.0–8.0)

## 2020-02-07 LAB — LIPID PANEL
Cholesterol: 157 mg/dL (ref 0–200)
HDL: 35 mg/dL — ABNORMAL LOW (ref >40)
LDL Cholesterol: 88 mg/dL (ref 0–99)
Total CHOL/HDL Ratio: 4.5 RATIO
Triglycerides: 172 mg/dL — ABNORMAL HIGH (ref <150)
VLDL: 34 mg/dL (ref 0–40)

## 2020-02-07 LAB — ECHOCARDIOGRAM COMPLETE
Area-P 1/2: 3.28 cm2
Height: 64 in
S' Lateral: 3.19 cm
Weight: 2917.13 oz

## 2020-02-07 LAB — HEMOGLOBIN A1C
Hgb A1c MFr Bld: 5.9 % — ABNORMAL HIGH (ref 4.8–5.6)
Mean Plasma Glucose: 122.63 mg/dL

## 2020-02-07 LAB — MAGNESIUM: Magnesium: 2 mg/dL (ref 1.7–2.4)

## 2020-02-07 MED ORDER — AMLODIPINE BESYLATE 5 MG PO TABS
2.5000 mg | ORAL_TABLET | Freq: Every day | ORAL | Status: DC
  Administered 2020-02-07: 2.5 mg via ORAL
  Filled 2020-02-07: qty 1

## 2020-02-07 NOTE — Progress Notes (Signed)
EEG complete - results pending 

## 2020-02-07 NOTE — Discharge Summary (Signed)
Physician Discharge Summary  Kristina [name] TZG:[id number] DOB: [birth year] DOA: 02/06/2020  PCP: Asencion Noble, MD  Admit date: 02/06/2020 Discharge date: 02/07/2020  Admitted From: Home  Disposition:  Home   Recommendations for Outpatient Follow-up:  1. Follow up with PCP Dr. Willey Blade in 1 week 2. Follow up with Neurology in 4-6 weeks    Home Health: None  Equipment/Devices: None  Discharge Condition: Good  CODE STATUS: FULL Diet recommendation: Regular  Brief/Interim Summary: Kristina [name] is a 74 y.o. F with HTN, TIA, and recurrent episodes of amnesia and headache who presents with another similar spell.  Patient was last known normal at 9:30AM on 12/6.  Has been related that the patient was seen walking aimlessly in the house around that time, and shortly after when he sat down for breakfast she was staring at her food and not speaking.  After a few minutes, the patient did speak and was able to answer some of his questions without dysarthria, focal weakness, but remained impaired.  The patient was brought to the ER, where she returned to her normal mentation, and developed a severe headache, associated with photophobia.  Of note, the patient has had 2 prior episodes similar to this over the last 15 years, in which she had a prodrome of confusion and amnesia, followed by a severe headache with photophobia and malaise.  Both of those times, she was evaluated by neurology and diagnosed with complex partial seizure, but without indication for AED.     PRINCIPAL HOSPITAL DIAGNOSIS: Amnesia with headache    Discharge Diagnoses:   Amnesia with headache The patient has had recurrent stereotypic episodes of decreased awareness with amnesia, followed by headache with photophobia.  Her current episode resolved without incident.  CT head unremarkable.  MRI brain unremarkable.  EEG unremarkable.  Carotid ultrasound unremarkable.  She has returned to baseline and has no further symptoms.  Her  headache is resolved.  She is safe for discharge, with follow-up with neurology as an outpatient.          Discharge Instructions  Discharge Instructions    Increase activity slowly   Complete by: As directed      Allergies as of 02/07/2020      Reactions   Lactose Intolerance (gi) Other (See Comments)   Upset Stomach       Medication List    TAKE these medications   acetaminophen 500 MG tablet Commonly known as: TYLENOL Take 1,000 mg by mouth daily as needed for moderate pain or headache.   amLODipine 2.5 MG tablet Commonly known as: NORVASC Take 2.5 mg by mouth daily.   Crestor 10 MG tablet Generic drug: rosuvastatin Take 10 mg by mouth every evening.   famotidine 10 MG tablet Commonly known as: PEPCID Take 10 mg by mouth daily.   Fish Oil 1000 MG Caps Take 1,000 mg by mouth daily.   levocetirizine 5 MG tablet Commonly known as: XYZAL Take 5 mg by mouth daily.   meloxicam 15 MG tablet Commonly known as: MOBIC Take 15 mg by mouth every evening.   multivitamin with minerals Tabs tablet Take 1 tablet by mouth daily.   Premarin vaginal cream Generic drug: conjugated estrogens Place 1 Applicatorful vaginally every other day.   sodium chloride 0.65 % nasal spray Commonly known as: OCEAN Place 1 spray into the nose as needed for congestion.       Follow-up Information    Asencion Noble, MD. Schedule an appointment as soon as possible for  a visit in 1 week(s).   Specialty: Internal Medicine Contact information: [address] [place] [address] [phone]        Phillips Odor, MD Follow up.   Specialty: Neurology Contact information: [address] [place] [address] [phone]              Allergies  Allergen Reactions  . Lactose Intolerance (Gi) Other (See Comments)    Upset Stomach     Consultations:  Nuerology   Procedures/Studies: EEG  Result Date: 02/07/2020 Lora Havens, MD     02/07/2020   4:41 PM Patient Name: Kristina [name] MRN: [id number] Epilepsy Attending: Lora Havens Referring Physician/Provider: Dr Shanon Brow Tat Date: `02/07/2020 Duration: 24.58 mins Patient history: 74yo F with transient alteration of awareness. EEG to evaluate for seizure. Level of alertness: Awake, asleep AEDs during EEG study: None Technical aspects: This EEG study was done with scalp electrodes positioned according to the 10-20 International system of electrode placement. Electrical activity was acquired at a sampling rate of 500Hz  and reviewed with a high frequency filter of 70Hz  and a low frequency filter of 1Hz . EEG data were recorded continuously and digitally stored. Description: The posterior dominant rhythm consists of 9-10 Hz activity of moderate voltage (25-35 uV) seen predominantly in posterior head regions, symmetric and reactive to eye opening and eye closing. Sleep was characterized by vertex waves, sleep spindles (12 to 14 Hz), maximal frontocentral region. Physiologic photic driving was seen during photic stimulation. Hyperventilation was not performed.   IMPRESSION: This study is within normal limits. No seizures or epileptiform discharges were seen throughout the recording. Lora Havens   MR Brain Wo Contrast (neuro protocol)  Result Date: 02/06/2020 CLINICAL DATA:  Transient ischemic attack (TIA) Mental status change, unknown cause EXAM: MRI HEAD WITHOUT CONTRAST TECHNIQUE: Multiplanar, multiecho pulse sequences of the brain and surrounding structures were obtained without intravenous contrast. COMPARISON:  02/06/2020 head CT and prior.  05/18/2012 MRI head. FINDINGS: Brain: No diffusion-weighted signal abnormality. No intracranial hemorrhage. No midline shift, ventriculomegaly or extra-axial fluid collection. No mass lesion. Mild chronic microvascular ischemic changes. Vascular: Normal flow voids. Skull and upper cervical spine: Normal marrow signal. Sinuses/Orbits: Sequela of bilateral lens  replacement. Pneumatized paranasal sinuses mastoid air cells. Other: None. IMPRESSION: 1. No acute intracranial process. 2. Mild chronic microvascular ischemic changes. Electronically Signed   By: Primitivo Gauze M.D.   On: 02/06/2020 13:48   US Carotid Bilateral (at [facility] and [facility] only)  Result Date: 02/07/2020 CLINICAL DATA:  TIA and hyperlipidemia. EXAM: BILATERAL CAROTID DUPLEX ULTRASOUND TECHNIQUE: Pearline Cables scale imaging, color Doppler and duplex ultrasound were performed of bilateral carotid and vertebral arteries in the neck. COMPARISON:  None. FINDINGS: Criteria: Quantification of carotid stenosis is based on velocity parameters that correlate the residual internal carotid diameter with NASCET-based stenosis levels, using the diameter of the distal internal carotid lumen as the denominator for stenosis measurement. The following velocity measurements were obtained: RIGHT ICA:  111/32 cm/sec CCA:  10/62 cm/sec SYSTOLIC ICA/CCA RATIO:  1.7 ECA:  104 cm/sec LEFT ICA:  120/43 cm/sec CCA:  69/48 cm/sec SYSTOLIC ICA/CCA RATIO:  1.7 ECA:  84 cm/sec RIGHT CAROTID ARTERY: Mild eccentric plaque present at the level of the right carotid bulb. No evidence of right ICA plaque or stenosis. RIGHT VERTEBRAL ARTERY: Antegrade flow with normal waveform and velocity. LEFT CAROTID ARTERY: No focal plaque identified. No evidence of left carotid stenosis. LEFT VERTEBRAL ARTERY: Antegrade flow with normal waveform and velocity.  IMPRESSION: Mild plaque at the level of the right carotid bulb. No evidence of right ICA or left-sided carotid stenosis. Electronically Signed   By: Aletta Edouard M.D.   On: 02/07/2020 08:31   ECHOCARDIOGRAM COMPLETE  Result Date: 02/07/2020    ECHOCARDIOGRAM REPORT   Patient Name:   Kristina [name] Date of Exam: 02/07/2020 Medical Rec #:  [id number]      Height:       64.0 in Accession #:    [id number]     Weight:       182.3 lb Date of Birth:  30-Jul-1945      BSA:          1.881 m Patient Age:    21  years       BP:           123/59 mmHg Patient Gender: F              HR:           49 bpm. Exam Location:  [facility] Procedure: 2D Echo Indications:    Stroke 434.91 / I163.9  History:        Patient has prior history of Echocardiogram examinations, most                 recent 05/18/2012. TIA; Risk Factors:Non-Smoker and Dyslipidemia.                 Acute encephalopathy, Amnesia memory loss.  Sonographer:    Leavy Cella RDCS (AE) Referring Phys: [phone] DAVID TAT IMPRESSIONS  1. Left ventricular ejection fraction, by estimation, is 55 to 60%. The left ventricle has normal function. The left ventricle has no regional wall motion abnormalities. There is mild left ventricular hypertrophy. Left ventricular diastolic parameters are indeterminate.  2. Right ventricular systolic function is normal. The right ventricular size is normal. There is normal pulmonary artery systolic pressure. The estimated right ventricular systolic pressure is 08.6 mmHg.  3. The mitral valve is grossly normal. Trivial mitral valve regurgitation.  4. The aortic valve is tricuspid. Aortic valve regurgitation is not visualized.  5. The inferior vena cava is normal in size with greater than 50% respiratory variability, suggesting right atrial pressure of 3 mmHg. FINDINGS  Left Ventricle: Left ventricular ejection fraction, by estimation, is 55 to 60%. The left ventricle has normal function. The left ventricle has no regional wall motion abnormalities. The left ventricular internal cavity size was normal in size. There is  mild left ventricular hypertrophy. Left ventricular diastolic parameters are indeterminate. Right Ventricle: The right ventricular size is normal. No increase in right ventricular wall thickness. Right ventricular systolic function is normal. There is normal pulmonary artery systolic pressure. The tricuspid regurgitant velocity is 2.08 m/s, and  with an assumed right atrial pressure of 3 mmHg, the estimated right ventricular  systolic pressure is 76.1 mmHg. Left Atrium: Left atrial size was normal in size. Right Atrium: Right atrial size was normal in size. Pericardium: There is no evidence of pericardial effusion. Mitral Valve: The mitral valve is grossly normal. Trivial mitral valve regurgitation. Tricuspid Valve: The tricuspid valve is grossly normal. Tricuspid valve regurgitation is mild. Aortic Valve: The aortic valve is tricuspid. There is mild to moderate aortic valve annular calcification. Aortic valve regurgitation is not visualized. Pulmonic Valve: The pulmonic valve was grossly normal. Pulmonic valve regurgitation is trivial. Aorta: The aortic root is normal in size and structure. Venous: The inferior vena cava is normal in size with greater  than 50% respiratory variability, suggesting right atrial pressure of 3 mmHg. IAS/Shunts: No atrial level shunt detected by color flow Doppler.  LEFT VENTRICLE PLAX 2D LVIDd:         4.15 cm  Diastology LVIDs:         3.19 cm  LV e' medial:    6.53 cm/s LV PW:         1.13 cm  LV E/e' medial:  12.5 LV IVS:        1.04 cm  LV e' lateral:   8.05 cm/s LVOT diam:     1.80 cm  LV E/e' lateral: 10.2 LVOT Area:     2.54 cm  RIGHT VENTRICLE RV S prime:     9.46 cm/s TAPSE (M-mode): 2.0 cm LEFT ATRIUM             Index       RIGHT ATRIUM          Index LA diam:        3.20 cm 1.70 cm/m  RA Area:     9.63 cm LA Vol (A2C):   30.2 ml 16.06 ml/m RA Volume:   20.50 ml 10.90 ml/m LA Vol (A4C):   30.9 ml 16.43 ml/m LA Biplane Vol: 30.9 ml 16.43 ml/m   AORTA Ao Root diam: 2.60 cm MITRAL VALVE               TRICUSPID VALVE MV Area (PHT): 3.28 cm    TR Peak grad:   17.3 mmHg MV Decel Time: 231 msec    TR Vmax:        208.00 cm/s MV E velocity: 81.80 cm/s MV A velocity: 70.70 cm/s  SHUNTS MV E/A ratio:  1.16        Systemic Diam: 1.80 cm Rozann Lesches MD Electronically signed by Rozann Lesches MD Signature Date/Time: 02/07/2020/12:43:03 PM    Final    CT HEAD CODE STROKE WO CONTRAST`  Result  Date: 02/06/2020 CLINICAL DATA:  Code stroke.  Neuro deficit, acute, stroke suspected EXAM: CT HEAD WITHOUT CONTRAST TECHNIQUE: Contiguous axial images were obtained from the base of the skull through the vertex without intravenous contrast. COMPARISON:  05/18/2012 MRI head and prior. 05/18/2012 head CT and prior. FINDINGS: Brain: No acute infarct or intracranial hemorrhage. No mass lesion. No midline shift, ventriculomegaly or extra-axial fluid collection. Minimal chronic microvascular ischemic changes. Vascular: No hyperdense vessel or unexpected calcification. Minimal carotid siphon atherosclerotic calcifications. Skull: Negative for fracture or focal lesion. Sinuses/Orbits: Normal orbits. Clear paranasal sinuses. No mastoid effusion. Other: None. ASPECTS [facility] Stroke Program Early CT Score) - Ganglionic level infarction (caudate, lentiform nuclei, internal capsule, insula, M1-M3 cortex): 7 - Supraganglionic infarction (M4-M6 cortex): 3 Total score (0-10 with 10 being normal): Is 10 IMPRESSION: 1. No evidence of acute intracranial abnormality. 2. ASPECTS is 10. Code stroke imaging results were communicated on 02/06/2020 at 11:59 am to provider Dr. Rogene Houston via telephone, who verbally acknowledged these results. Electronically Signed   By: Primitivo Gauze M.D.   On: 02/06/2020 12:00       Subjective: Patient is feeling well.  Appetite is good.  Headache is resolving.  No vomiting or confusion.  No focal weakness or numbness.  No visual disturbance, no speech disturbance.  Discharge Exam: Vitals:   02/07/20 0749 02/07/20 1416  BP: (!) 123/59 125/67  Pulse: (!) 49 (!) 55  Resp: 18 18  Temp: 98.2 F (36.8 C) 98.2 F (36.8 C)  SpO2: 93% 95%  Vitals:   02/07/20 0115 02/07/20 0513 02/07/20 0749 02/07/20 1416  BP: 137/64 134/64 (!) 123/59 125/67  Pulse: (!) 53 (!) 51 (!) 49 (!) 55  Resp:  18 18 18   Temp: 98 F (36.7 C) 97.6 F (36.4 C) 98.2 F (36.8 C) 98.2 F (36.8 C)  TempSrc: Oral  Oral Oral Oral  SpO2: 97% 96% 93% 95%  Weight:      Height:        General: Pt is alert, awake, not in acute distress Cardiovascular: RRR, nl S1-S2, no murmurs appreciated.   No LE edema.   Respiratory: Normal respiratory rate and rhythm.  CTAB without rales or wheezes. Abdominal: Abdomen soft and non-tender.  No distension or HSM.   Neuro/Psych: Strength symmetric in upper and lower extremities.  Judgment and insight appear normal.   The results of significant diagnostics from this hospitalization (including imaging, microbiology, ancillary and laboratory) are listed below for reference.     Microbiology: Recent Results (from the past 240 hour(s))  Resp Panel by RT-PCR (Flu A&B, Covid) Nasopharyngeal Swab     Status: None   Collection Time: 02/06/20  2:13 PM   Specimen: Nasopharyngeal Swab; Nasopharyngeal(NP) swabs in vial transport medium  Result Value Ref Range Status   SARS Coronavirus 2 by RT PCR NEGATIVE NEGATIVE Final    Comment: (NOTE) SARS-CoV-2 target nucleic acids are NOT DETECTED.  The SARS-CoV-2 RNA is generally detectable in upper respiratory specimens during the acute phase of infection. The lowest concentration of SARS-CoV-2 viral copies this assay can detect is 138 copies/mL. A negative result does not preclude SARS-Cov-2 infection and should not be used as the sole basis for treatment or other patient management decisions. A negative result may occur with  improper specimen collection/handling, submission of specimen other than nasopharyngeal swab, presence of viral mutation(s) within the areas targeted by this assay, and inadequate number of viral copies(<138 copies/mL). A negative result must be combined with clinical observations, patient history, and epidemiological information. The expected result is Negative.  Fact Sheet for Patients:  EntrepreneurPulse.com.au  Fact Sheet for Healthcare Providers:   IncredibleEmployment.be  This test is no t yet approved or cleared by the [place] FDA and  has been authorized for detection and/or diagnosis of SARS-CoV-2 by FDA under an Emergency Use Authorization (EUA). This EUA will remain  in effect (meaning this test can be used) for the duration of the COVID-19 declaration under Section 564(b)(1) of the Act, 21 U.S.C.section 360bbb-3(b)(1), unless the authorization is terminated  or revoked sooner.       Influenza A by PCR NEGATIVE NEGATIVE Final   Influenza B by PCR NEGATIVE NEGATIVE Final    Comment: (NOTE) The Xpert Xpress SARS-CoV-2/FLU/RSV plus assay is intended as an aid in the diagnosis of influenza from Nasopharyngeal swab specimens and should not be used as a sole basis for treatment. Nasal washings and aspirates are unacceptable for Xpert Xpress SARS-CoV-2/FLU/RSV testing.  Fact Sheet for Patients: EntrepreneurPulse.com.au  Fact Sheet for Healthcare Providers: IncredibleEmployment.be  This test is not yet approved or cleared by the [place] FDA and has been authorized for detection and/or diagnosis of SARS-CoV-2 by FDA under an Emergency Use Authorization (EUA). This EUA will remain in effect (meaning this test can be used) for the duration of the COVID-19 declaration under Section 564(b)(1) of the Act, 21 U.S.C. section 360bbb-3(b)(1), unless the authorization is terminated or revoked.  Performed at [facility], [address] [place], [place] [address]  Labs: BNP (last 3 results) No results for input(s): BNP in the last 8760 hours. Basic Metabolic Panel: Recent Labs  Lab 02/06/20 1148 02/07/20 0648  NA 138 139  K 3.8 3.7  CL 103 105  CO2 24 25  GLUCOSE 142* 111*  BUN 16 17  CREATININE 0.79 0.68  CALCIUM 9.2 8.6*  MG  --  2.0   Liver Function Tests: Recent Labs  Lab 02/06/20 1148  AST 26  ALT 21  ALKPHOS 74  BILITOT 0.7  PROT 7.1   ALBUMIN 4.1   No results for input(s): LIPASE, AMYLASE in the last 168 hours. No results for input(s): AMMONIA in the last 168 hours. CBC: Recent Labs  Lab 02/06/20 1148  WBC 7.2  NEUTROABS 5.7  HGB 15.4*  HCT 45.0  MCV 93.2  PLT 268   Cardiac Enzymes: No results for input(s): CKTOTAL, CKMB, CKMBINDEX, TROPONINI in the last 168 hours. BNP: Invalid input(s): POCBNP CBG: No results for input(s): GLUCAP in the last 168 hours. D-Dimer No results for input(s): DDIMER in the last 72 hours. Hgb A1c Recent Labs    02/06/20 1148  HGBA1C 5.9*   Lipid Profile Recent Labs    02/07/20 0648  CHOL 157  HDL 35*  LDLCALC 88  TRIG 172*  CHOLHDL 4.5   Thyroid function studies No results for input(s): TSH, T4TOTAL, T3FREE, THYROIDAB in the last 72 hours.  Invalid input(s): FREET3 Anemia work up No results for input(s): VITAMINB12, FOLATE, FERRITIN, TIBC, IRON, RETICCTPCT in the last 72 hours. Urinalysis    Component Value Date/Time   COLORURINE YELLOW 02/06/2020 2310   APPEARANCEUR CLEAR 02/06/2020 2310   LABSPEC 1.013 02/06/2020 2310   PHURINE 7.0 02/06/2020 2310   GLUCOSEU NEGATIVE 02/06/2020 2310   HGBUR NEGATIVE 02/06/2020 2310   BILIRUBINUR NEGATIVE 02/06/2020 2310   BILIRUBINUR small (A) 12/10/2018 1425   KETONESUR 20 (A) 02/06/2020 2310   PROTEINUR NEGATIVE 02/06/2020 2310   UROBILINOGEN 4.0 (A) 12/10/2018 1425   NITRITE NEGATIVE 02/06/2020 2310   LEUKOCYTESUR SMALL (A) 02/06/2020 2310   Sepsis Labs Invalid input(s): PROCALCITONIN,  WBC,  LACTICIDVEN Microbiology Recent Results (from the past 240 hour(s))  Resp Panel by RT-PCR (Flu A&B, Covid) Nasopharyngeal Swab     Status: None   Collection Time: 02/06/20  2:13 PM   Specimen: Nasopharyngeal Swab; Nasopharyngeal(NP) swabs in vial transport medium  Result Value Ref Range Status   SARS Coronavirus 2 by RT PCR NEGATIVE NEGATIVE Final    Comment: (NOTE) SARS-CoV-2 target nucleic acids are NOT DETECTED.  The  SARS-CoV-2 RNA is generally detectable in upper respiratory specimens during the acute phase of infection. The lowest concentration of SARS-CoV-2 viral copies this assay can detect is 138 copies/mL. A negative result does not preclude SARS-Cov-2 infection and should not be used as the sole basis for treatment or other patient management decisions. A negative result may occur with  improper specimen collection/handling, submission of specimen other than nasopharyngeal swab, presence of viral mutation(s) within the areas targeted by this assay, and inadequate number of viral copies(<138 copies/mL). A negative result must be combined with clinical observations, patient history, and epidemiological information. The expected result is Negative.  Fact Sheet for Patients:  EntrepreneurPulse.com.au  Fact Sheet for Healthcare Providers:  IncredibleEmployment.be  This test is no t yet approved or cleared by the [place] FDA and  has been authorized for detection and/or diagnosis of SARS-CoV-2 by FDA under an Emergency Use Authorization (EUA). This EUA will remain  in  effect (meaning this test can be used) for the duration of the COVID-19 declaration under Section 564(b)(1) of the Act, 21 U.S.C.section 360bbb-3(b)(1), unless the authorization is terminated  or revoked sooner.       Influenza A by PCR NEGATIVE NEGATIVE Final   Influenza B by PCR NEGATIVE NEGATIVE Final    Comment: (NOTE) The Xpert Xpress SARS-CoV-2/FLU/RSV plus assay is intended as an aid in the diagnosis of influenza from Nasopharyngeal swab specimens and should not be used as a sole basis for treatment. Nasal washings and aspirates are unacceptable for Xpert Xpress SARS-CoV-2/FLU/RSV testing.  Fact Sheet for Patients: EntrepreneurPulse.com.au  Fact Sheet for Healthcare Providers: IncredibleEmployment.be  This test is not yet approved or  cleared by the [place] FDA and has been authorized for detection and/or diagnosis of SARS-CoV-2 by FDA under an Emergency Use Authorization (EUA). This EUA will remain in effect (meaning this test can be used) for the duration of the COVID-19 declaration under Section 564(b)(1) of the Act, 21 U.S.C. section 360bbb-3(b)(1), unless the authorization is terminated or revoked.  Performed at [facility], [address] [place], [place] [address]      Time coordinating discharge: 25 minutes      SIGNED:   Edwin Dada, MD  [facility] 02/07/2020, 8:07 PM

## 2020-02-07 NOTE — Care Management Obs Status (Signed)
[place] NOTIFICATION   Patient Details  Name: TEANA LINDAHL MRN: [id number] Date of Birth: Aug 28, 1945   Medicare Observation Status Notification Given:  Yes    Tommy Medal 02/07/2020, 4:26 PM

## 2020-02-07 NOTE — Plan of Care (Signed)
  Problem: Acute Rehab PT Goals(only PT should resolve) Goal: PT Additional Goal #1 Outcome: Completed/Met Flowsheets (Taken 02/07/2020 0925) Additional Goal #1: Pt will verbalize understanding when educated on safety with mobility and progressing back to normal daily activities at home.  Talbot Grumbling PT, DPT 02/07/20, 9:27 AM [phone]

## 2020-02-07 NOTE — Procedures (Signed)
Patient Name: Kristina [name]  MRN: [id number]  Epilepsy Attending: Lora Havens  Referring Physician/Provider: Dr Shanon Brow Tat Date: `02/07/2020 Duration: 24.58 mins  Patient history: 74yo F with transient alteration of awareness. EEG to evaluate for seizure.  Level of alertness: Awake, asleep  AEDs during EEG study: None  Technical aspects: This EEG study was done with scalp electrodes positioned according to the 10-20 International system of electrode placement. Electrical activity was acquired at a sampling rate of 500Hz  and reviewed with a high frequency filter of 70Hz  and a low frequency filter of 1Hz . EEG data were recorded continuously and digitally stored.   Description: The posterior dominant rhythm consists of 9-10 Hz activity of moderate voltage (25-35 uV) seen predominantly in posterior head regions, symmetric and reactive to eye opening and eye closing. Sleep was characterized by vertex waves, sleep spindles (12 to 14 Hz), maximal frontocentral region. Physiologic photic driving was seen during photic stimulation. Hyperventilation was not performed.     IMPRESSION: This study is within normal limits. No seizures or epileptiform discharges were seen throughout the recording.    Mazen Marcin Barbra Sarks

## 2020-02-07 NOTE — Evaluation (Signed)
Physical Therapy Evaluation Only Patient Details Name: Kristina [name] MRN: [id number] DOB: 1946/02/12 Today's Date: 02/07/2020   History of Present Illness  74 y.o. female with medical history of HTN, seizure disorder, TIA presenting with altered mental status, without dysarthria, visual distrubance or focal motor deficit, and c/o headache. MRI negative for acute infarct.  Clinical Impression  Physical therapy evaluation completed, patient is at baseline and no further PT services recommended at this time. Pt able to ambulate around hospital floor with IV pole, able to complete turns and direction changes without LOB. Pt's BLE strength symmetrical throughout, active dorsiflexion and no foot drop noted throughout gait cycle, heel to shin test symmetrical and without deficit bilaterally. Pt reports feeling "close" to baseline during ambulation and mobility. Pt with good family support at home and ready to return home with spousal support. Patient discharged to care of nursing for ambulation daily as tolerated for length of stay.     Follow Up Recommendations No PT follow up    Equipment Recommendations  None recommended by PT    Recommendations for Other Services       Precautions / Restrictions Precautions Precautions: None Restrictions Weight Bearing Restrictions: No      Mobility  Bed Mobility Overal bed mobility: Modified Independent  General bed mobility comments: in chair upon arrival- per OT eval pt is modified independent    Transfers Overall transfer level: Modified independent Equipment used: None  General transfer comment: STS from chair with light UE assist on chair arms  Ambulation/Gait Ambulation/Gait assistance: Supervision;Modified independent (Device/Increase time) Gait Distance (Feet): 200 Feet Assistive device: IV Pole Gait Pattern/deviations: WFL(Within Functional Limits)  General Gait Details: slightly increased bil out-toeing, equal bil step length, good  dorsiflexion and no foot drop bil, cadence WNL, able to change directions and complete turns without LOB  Stairs            Wheelchair Mobility    Modified Rankin (Stroke Patients Only)       Balance Overall balance assessment: No apparent balance deficits (not formally assessed)        Pertinent Vitals/Pain Pain Assessment: Faces Faces Pain Scale: Hurts a little bit Pain Location: headache Pain Descriptors / Indicators: Headache Pain Intervention(s): Limited activity within patient's tolerance;Monitored during session;Premedicated before session    Home Living Family/patient expects to be discharged to:: Private residence Living Arrangements: Spouse/significant other Available Help at Discharge: Family;Available 24 hours/day Type of Home: House Home Access: Stairs to enter Entrance Stairs-Rails: Left (ascending) Entrance Stairs-Number of Steps: 2-3 Home Layout: One level Home Equipment: None      Prior Function Level of Independence: Independent         Comments: Independent in mobility and ADLs, ambulates community distances without AD, denies falls, drives, gardens daily     Hand Dominance   Dominant Hand: Right    Extremity/Trunk Assessment   Upper Extremity Assessment Upper Extremity Assessment: Defer to OT evaluation    Lower Extremity Assessment Lower Extremity Assessment: Overall WFL for tasks assessed (BLE AROM WNL, strength 4+/5, symmetrical, denies numbness/tingling, heel-to-shin test symmetrical and without deficit bil)    Cervical / Trunk Assessment Cervical / Trunk Assessment: Normal  Communication   Communication: No difficulties  Cognition Arousal/Alertness: Awake/alert Behavior During Therapy: WFL for tasks assessed/performed Overall Cognitive Status: Within Functional Limits for tasks assessed             General Comments      Exercises     Assessment/Plan  PT Assessment Patent does not need any further PT services   PT Problem List         PT Treatment Interventions      PT Goals (Current goals can be found in the Care Plan section)  Acute Rehab PT Goals Patient Stated Goal: "eat something" PT Goal Formulation: With patient Time For Goal Achievement: 02/21/20 Potential to Achieve Goals: Good    Frequency     Barriers to discharge        Co-evaluation               AM-PAC PT "6 Clicks" Mobility  Outcome Measure Help needed turning from your back to your side while in a flat bed without using bedrails?: None Help needed moving from lying on your back to sitting on the side of a flat bed without using bedrails?: None Help needed moving to and from a bed to a chair (including a wheelchair)?: None Help needed standing up from a chair using your arms (e.g., wheelchair or bedside chair)?: None Help needed to walk in hospital room?: None Help needed climbing 3-5 steps with a railing? : None 6 Click Score: 24    End of Session   Activity Tolerance: Patient tolerated treatment well Patient left: in chair;with call bell/phone within reach Nurse Communication: Mobility status PT Visit Diagnosis: Other symptoms and signs involving the nervous system (R29.898)    Time: 2376-2831 PT Time Calculation (min) (ACUTE ONLY): 8 min   Charges:   PT Evaluation $PT Eval Low Complexity: 1 Low           Tori Shernita Rabinovich PT, DPT 02/07/20, 9:23 AM [phone]

## 2020-02-07 NOTE — Progress Notes (Signed)
Nsg Discharge Note  Admit Date:  02/06/2020 Discharge date: 02/07/2020   Kristina [name] to be D/C'd Home per MD order.  AVS completed.  Copy for chart, and copy for patient signed, and dated. Patient/caregiver able to verbalize understanding.  Discharge Medication: Allergies as of 02/07/2020      Reactions   Lactose Intolerance (gi) Other (See Comments)   Upset Stomach       Medication List    TAKE these medications   acetaminophen 500 MG tablet Commonly known as: TYLENOL Take 1,000 mg by mouth daily as needed for moderate pain or headache.   amLODipine 2.5 MG tablet Commonly known as: NORVASC Take 2.5 mg by mouth daily.   Crestor 10 MG tablet Generic drug: rosuvastatin Take 10 mg by mouth every evening.   famotidine 10 MG tablet Commonly known as: PEPCID Take 10 mg by mouth daily.   Fish Oil 1000 MG Caps Take 1,000 mg by mouth daily.   levocetirizine 5 MG tablet Commonly known as: XYZAL Take 5 mg by mouth daily.   meloxicam 15 MG tablet Commonly known as: MOBIC Take 15 mg by mouth every evening.   multivitamin with minerals Tabs tablet Take 1 tablet by mouth daily.   Premarin vaginal cream Generic drug: conjugated estrogens Place 1 Applicatorful vaginally every other day.   sodium chloride 0.65 % nasal spray Commonly known as: OCEAN Place 1 spray into the nose as needed for congestion.       Discharge Assessment: Vitals:   02/07/20 0749 02/07/20 1416  BP: (!) 123/59 125/67  Pulse: (!) 49 (!) 55  Resp: 18 18  Temp: 98.2 F (36.8 C) 98.2 F (36.8 C)  SpO2: 93% 95%   Skin clean, dry and intact without evidence of skin break down, no evidence of skin tears noted. IV catheter discontinued intact. Site without signs and symptoms of complications - no redness or edema noted at insertion site, patient denies c/o pain - only slight tenderness at site.  Dressing with slight pressure applied.  D/c Instructions-Education: Discharge instructions given to  patient/family with verbalized understanding. D/c education completed with patient/family including follow up instructions, medication list, d/c activities limitations if indicated, with other d/c instructions as indicated by MD - patient able to verbalize understanding, all questions fully answered. Patient instructed to return to ED, call 911, or call MD for any changes in condition.  Patient escorted via [place], and D/C home via private auto.  Dorcas Mcmurray, LPN 79/09/2818 6:01 PM

## 2020-02-07 NOTE — Evaluation (Signed)
Occupational Therapy Evaluation Patient Details Name: Kristina [name] MRN: [id number] DOB: Apr 22, 1945 Today's Date: 02/07/2020    History of Present Illness Kristina [name] is a 74 y.o. female with medical history of hypertension, hyperlipidemia, seizure disorder, TIA presenting with altered mental status.  According to the patient and spouse, the patient was last known normal around 9:30 AM on the morning of 02/06/2020.  He noted the patient to be walking back and forth in the house aimlessly around that time.  Subsequently, they sat down to eat some breakfast around 10 AM.  Apparently, the patient was just staring at her bowl of grits for a few minutes and not speaking.  After about 5 to 10 minutes, the patient did speak and was able to answer some of his questions.  He did not notice any dysarthria, visual disturbance, or focal motor deficit.  The patient herself did not remember any of the events from this morning until she arrived to the emergency department.  Her last recollection was being in the emergency department room.  She complained of a headache that is bifrontal in nature. MRI negative for acute infarct    Clinical Impression   Pt agreeable to OT evaluation this am. Pt is performing ADLs independently, BUE strength is [facility], coordination and sensation are intact. Pt also performing functional mobility independently in room. Pt is at baseline for ADL completion, no further OT services required at this time.     Follow Up Recommendations  No OT follow up    Equipment Recommendations  None recommended by OT       Precautions / Restrictions Precautions Precautions: None Restrictions Weight Bearing Restrictions: No      Mobility Bed Mobility Overal bed mobility: Modified Independent                  Transfers Overall transfer level: Modified independent Equipment used: None                      ADL either performed or assessed with clinical judgement   ADL  Overall ADL's : Needs assistance/impaired     Grooming: Wash/dry hands;Modified independent;Standing               Lower Body Dressing: Modified independent;Sitting/lateral leans;Sit to/from stand   Toilet Transfer: Modified Independent;Ambulation   Toileting- Clothing Manipulation and Hygiene: Modified independent;Sitting/lateral lean;Sit to/from stand       Functional mobility during ADLs: Supervision/safety       Vision Baseline Vision/History: Wears glasses Wears Glasses: Distance only Patient Visual Report: No change from baseline Vision Assessment?: No apparent visual deficits            Pertinent Vitals/Pain Pain Assessment: No/denies pain     Hand Dominance Right   Extremity/Trunk Assessment Upper Extremity Assessment Upper Extremity Assessment: Overall WFL for tasks assessed   Lower Extremity Assessment Lower Extremity Assessment: Defer to PT evaluation   Cervical / Trunk Assessment Cervical / Trunk Assessment: Normal   Communication Communication Communication: No difficulties   Cognition Arousal/Alertness: Awake/alert Behavior During Therapy: WFL for tasks assessed/performed Overall Cognitive Status: Within Functional Limits for tasks assessed                                                Home Living Family/patient expects to be discharged to:: Private residence Living Arrangements: Spouse/significant  other Available Help at Discharge: Family;Available 24 hours/day Type of Home: House Home Access: Stairs to enter [employer] of Steps: 2-3 Entrance Stairs-Rails: Left (ascending) Home Layout: One level     Bathroom Shower/Tub: Teacher, early years/pre: Standard     Home Equipment: None          Prior Functioning/Environment Level of Independence: Independent        Comments: Independent in mobility, ADLs, driving        OT Problem List: Decreased activity tolerance       AM-PAC  OT "6 Clicks" Daily Activity     Outcome Measure Help from another person eating meals?: None Help from another person taking care of personal grooming?: None Help from another person toileting, which includes using toliet, bedpan, or urinal?: None Help from another person bathing (including washing, rinsing, drying)?: None Help from another person to put on and taking off regular upper body clothing?: None Help from another person to put on and taking off regular lower body clothing?: None 6 Click Score: 24   End of Session    Activity Tolerance: Patient tolerated treatment well Patient left: in chair;with call bell/phone within reach  OT Visit Diagnosis: Muscle weakness (generalized) (M62.81)                Time: 7681-1572 OT Time Calculation (min): 14 min Charges:  OT General Charges $OT Visit: 1 Visit OT Evaluation $OT Eval Low Complexity: 1 Low   Guadelupe Sabin, OTR/L  [phone] 02/07/2020, 8:33 AM

## 2020-02-07 NOTE — Plan of Care (Signed)
  Problem: Education: Goal: Knowledge of General Education information will improve Description: Including pain rating scale, medication(s)/side effects and non-pharmacologic comfort measures Outcome: Adequate for Discharge   Problem: Health Behavior/Discharge Planning: Goal: Ability to manage health-related needs will improve Outcome: Adequate for Discharge   Problem: Clinical Measurements: Goal: Ability to maintain clinical measurements within normal limits will improve Outcome: Adequate for Discharge Goal: Will remain free from infection Outcome: Adequate for Discharge Goal: Diagnostic test results will improve Outcome: Adequate for Discharge Goal: Respiratory complications will improve Outcome: Adequate for Discharge Goal: Cardiovascular complication will be avoided Outcome: Adequate for Discharge   Problem: Activity: Goal: Risk for activity intolerance will decrease Outcome: Adequate for Discharge   Problem: Nutrition: Goal: Adequate nutrition will be maintained Outcome: Adequate for Discharge   Problem: Coping: Goal: Level of anxiety will decrease Outcome: Adequate for Discharge   Problem: Elimination: Goal: Will not experience complications related to bowel motility Outcome: Adequate for Discharge Goal: Will not experience complications related to urinary retention Outcome: Adequate for Discharge   Problem: Pain Managment: Goal: General experience of comfort will improve Outcome: Adequate for Discharge   Problem: Safety: Goal: Ability to remain free from injury will improve Outcome: Adequate for Discharge   Problem: Skin Integrity: Goal: Risk for impaired skin integrity will decrease Outcome: Adequate for Discharge   Problem: Education: Goal: Knowledge of disease or condition will improve 02/07/2020 1854 by Marcina Millard, LPN Outcome: Adequate for Discharge 02/07/2020 1852 by Marcina Millard, LPN Outcome: Progressing Goal: Knowledge of secondary  prevention will improve 02/07/2020 1854 by Marcina Millard, LPN Outcome: Adequate for Discharge 02/07/2020 1852 by Marcina Millard, LPN Outcome: Progressing   Problem: Self-Care: Goal: Ability to participate in self-care as condition permits will improve 02/07/2020 1854 by Marcina Millard, LPN Outcome: Adequate for Discharge 02/07/2020 1852 by Marcina Millard, LPN Outcome: Progressing   Problem: Ischemic Stroke/TIA Tissue Perfusion: Goal: Complications of ischemic stroke/TIA will be minimized 02/07/2020 1854 by Marcina Millard, LPN Outcome: Adequate for Discharge 02/07/2020 1852 by Marcina Millard, LPN Outcome: Progressing

## 2020-02-07 NOTE — Progress Notes (Signed)
*  PRELIMINARY RESULTS* Echocardiogram 2D Echocardiogram has been performed.  Leavy Cella 02/07/2020, 10:23 AM

## 2020-02-07 NOTE — Plan of Care (Signed)
  Problem: Education: Goal: Knowledge of disease or condition will improve Outcome: Progressing Goal: Knowledge of secondary prevention will improve Outcome: Progressing   Problem: Self-Care: Goal: Ability to participate in self-care as condition permits will improve Outcome: Progressing   Problem: Ischemic Stroke/TIA Tissue Perfusion: Goal: Complications of ischemic stroke/TIA will be minimized Outcome: Progressing

## 2020-02-10 DIAGNOSIS — G4451 Hemicrania continua: Secondary | ICD-10-CM | POA: Diagnosis not present

## 2020-08-10 ENCOUNTER — Other Ambulatory Visit (HOSPITAL_COMMUNITY): Payer: Self-pay | Admitting: Internal Medicine

## 2020-08-10 DIAGNOSIS — Z1231 Encounter for screening mammogram for malignant neoplasm of breast: Secondary | ICD-10-CM

## 2020-08-10 DIAGNOSIS — I10 Essential (primary) hypertension: Secondary | ICD-10-CM | POA: Diagnosis not present

## 2020-08-10 DIAGNOSIS — M1990 Unspecified osteoarthritis, unspecified site: Secondary | ICD-10-CM | POA: Diagnosis not present

## 2020-08-22 ENCOUNTER — Other Ambulatory Visit: Payer: Self-pay

## 2020-08-22 ENCOUNTER — Ambulatory Visit (HOSPITAL_COMMUNITY)
Admission: RE | Admit: 2020-08-22 | Discharge: 2020-08-22 | Disposition: A | Discharge status: Home / Self Care | Payer: Medicare Other | Source: Ambulatory Visit | Attending: Internal Medicine | Admitting: Internal Medicine

## 2020-08-22 DIAGNOSIS — Z1231 Encounter for screening mammogram for malignant neoplasm of breast: Secondary | ICD-10-CM

## 2021-01-07 DIAGNOSIS — Z23 Encounter for immunization: Secondary | ICD-10-CM | POA: Diagnosis not present

## 2021-02-01 DIAGNOSIS — H33301 Unspecified retinal break, right eye: Secondary | ICD-10-CM | POA: Diagnosis not present

## 2021-02-01 DIAGNOSIS — H43813 Vitreous degeneration, bilateral: Secondary | ICD-10-CM | POA: Diagnosis not present

## 2021-02-01 DIAGNOSIS — H524 Presbyopia: Secondary | ICD-10-CM | POA: Diagnosis not present

## 2021-02-01 DIAGNOSIS — H0100A Unspecified blepharitis right eye, upper and lower eyelids: Secondary | ICD-10-CM | POA: Diagnosis not present

## 2021-03-13 DIAGNOSIS — Z808 Family history of malignant neoplasm of other organs or systems: Secondary | ICD-10-CM | POA: Diagnosis not present

## 2021-03-13 DIAGNOSIS — D2371 Other benign neoplasm of skin of right lower limb, including hip: Secondary | ICD-10-CM | POA: Diagnosis not present

## 2021-03-13 DIAGNOSIS — L720 Epidermal cyst: Secondary | ICD-10-CM | POA: Diagnosis not present

## 2021-03-13 DIAGNOSIS — Z1283 Encounter for screening for malignant neoplasm of skin: Secondary | ICD-10-CM | POA: Diagnosis not present

## 2021-03-13 DIAGNOSIS — L821 Other seborrheic keratosis: Secondary | ICD-10-CM | POA: Diagnosis not present

## 2021-03-13 DIAGNOSIS — D239 Other benign neoplasm of skin, unspecified: Secondary | ICD-10-CM | POA: Diagnosis not present

## 2021-03-13 DIAGNOSIS — D235 Other benign neoplasm of skin of trunk: Secondary | ICD-10-CM | POA: Diagnosis not present

## 2021-03-13 DIAGNOSIS — D1801 Hemangioma of skin and subcutaneous tissue: Secondary | ICD-10-CM | POA: Diagnosis not present

## 2021-09-10 DIAGNOSIS — I10 Essential (primary) hypertension: Secondary | ICD-10-CM | POA: Diagnosis not present

## 2021-09-10 DIAGNOSIS — K219 Gastro-esophageal reflux disease without esophagitis: Secondary | ICD-10-CM | POA: Diagnosis not present

## 2021-09-10 DIAGNOSIS — R739 Hyperglycemia, unspecified: Secondary | ICD-10-CM | POA: Diagnosis not present

## 2021-09-10 DIAGNOSIS — Z131 Encounter for screening for diabetes mellitus: Secondary | ICD-10-CM | POA: Diagnosis not present

## 2021-09-23 DIAGNOSIS — K219 Gastro-esophageal reflux disease without esophagitis: Secondary | ICD-10-CM | POA: Diagnosis not present

## 2021-09-23 DIAGNOSIS — Z1231 Encounter for screening mammogram for malignant neoplasm of breast: Secondary | ICD-10-CM | POA: Diagnosis not present

## 2021-09-23 DIAGNOSIS — E039 Hypothyroidism, unspecified: Secondary | ICD-10-CM | POA: Diagnosis not present

## 2021-09-23 DIAGNOSIS — M25569 Pain in unspecified knee: Secondary | ICD-10-CM | POA: Diagnosis not present

## 2021-09-23 DIAGNOSIS — I10 Essential (primary) hypertension: Secondary | ICD-10-CM | POA: Diagnosis not present

## 2021-10-10 DIAGNOSIS — M25569 Pain in unspecified knee: Secondary | ICD-10-CM | POA: Diagnosis not present

## 2021-10-10 DIAGNOSIS — K219 Gastro-esophageal reflux disease without esophagitis: Secondary | ICD-10-CM | POA: Diagnosis not present

## 2021-10-10 DIAGNOSIS — E039 Hypothyroidism, unspecified: Secondary | ICD-10-CM | POA: Diagnosis not present

## 2021-10-10 DIAGNOSIS — I10 Essential (primary) hypertension: Secondary | ICD-10-CM | POA: Diagnosis not present

## 2021-10-23 IMAGING — CT CT HEAD CODE STROKE
3 series · 16 of 47 positions shown, 19 images · non-contrast
Comparison: 05/18/2012 MRI head and prior. 05/18/2012 head CT and
prior.

CLINICAL DATA: Code stroke.  Neuro deficit, acute, stroke suspected

EXAM:
CT HEAD WITHOUT CONTRAST
TECHNIQUE: Contiguous axial images were obtained from the base of the skull
through the vertex without intravenous contrast.

[Series 3: head w o · axial · 0.41mm/px · z∈[+0,+125]mm · 10 of 31 slices shown, 13 images]
[im 3/31  brain]
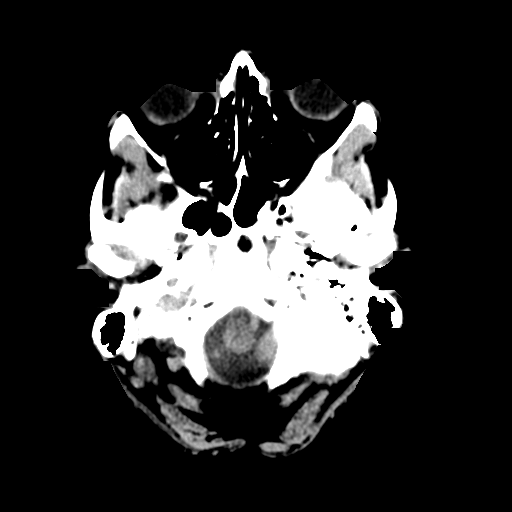
[im 3/31  bone]
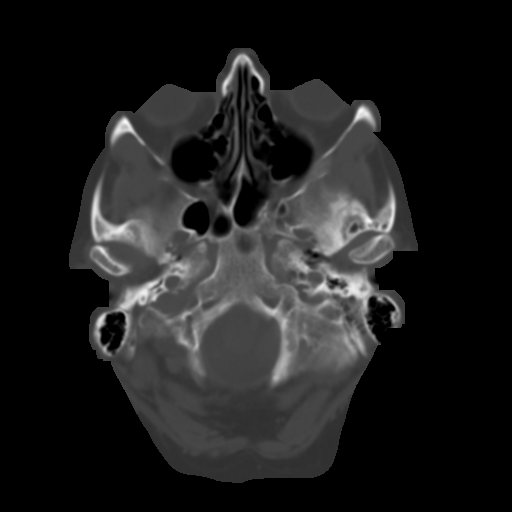
[im 6/31  brain]
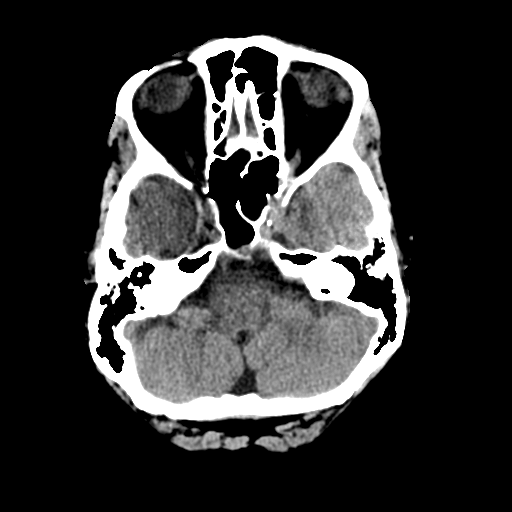
[im 9/31  brain]
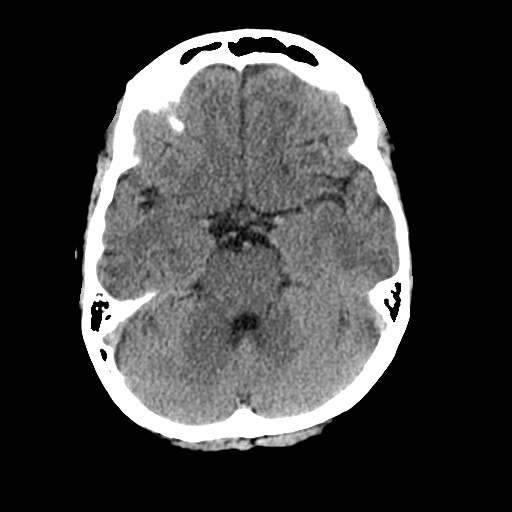
[im 11/31  brain]
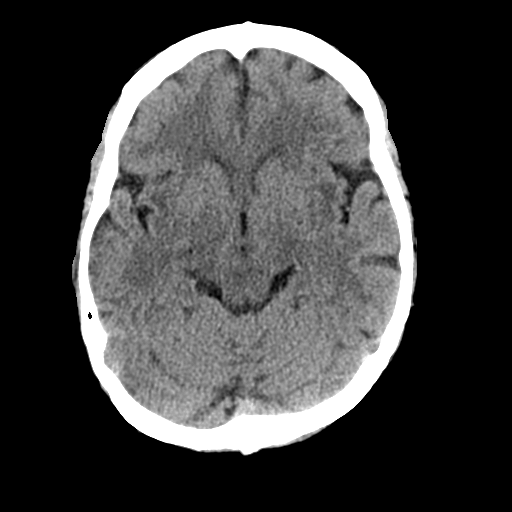
[im 14/31  brain]
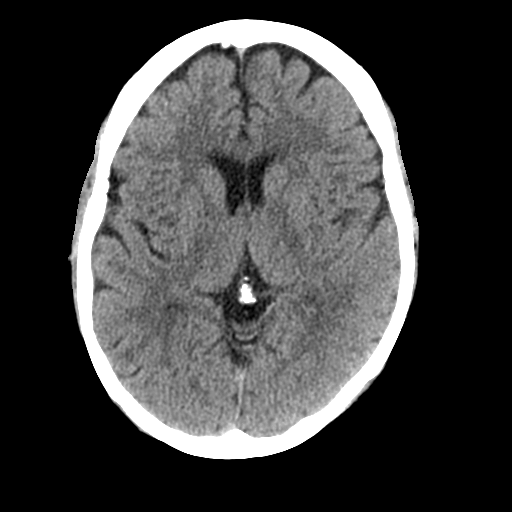
[im 14/31  bone]
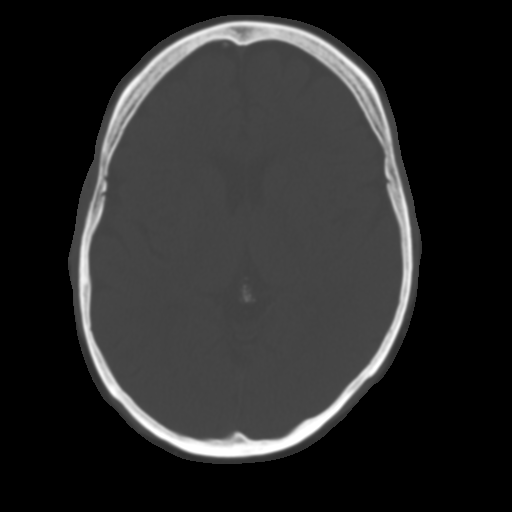
[im 17/31  brain]
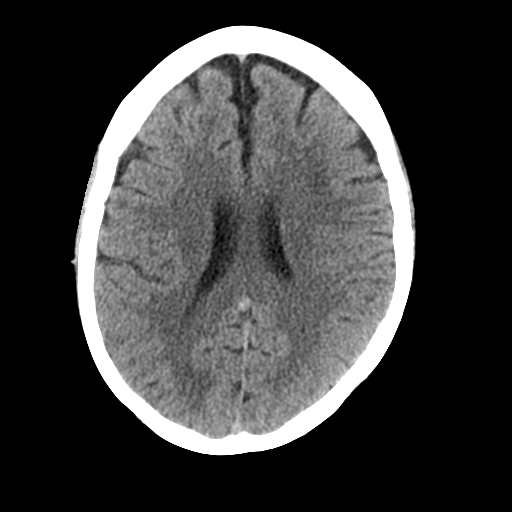
[im 20/31  brain]
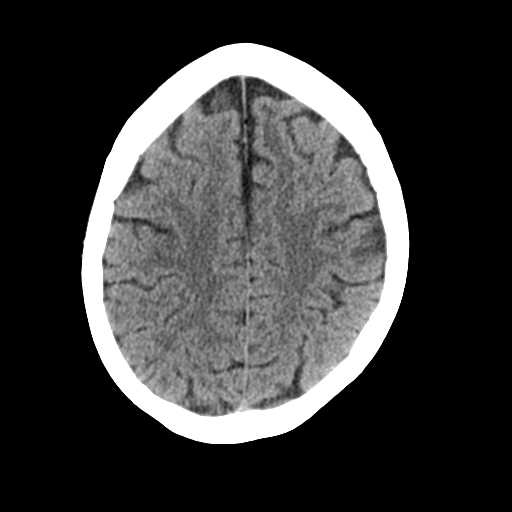
[im 23/31  brain]
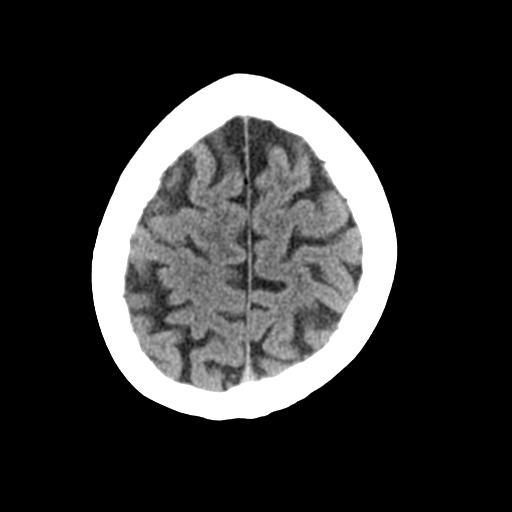
[im 25/31  brain]
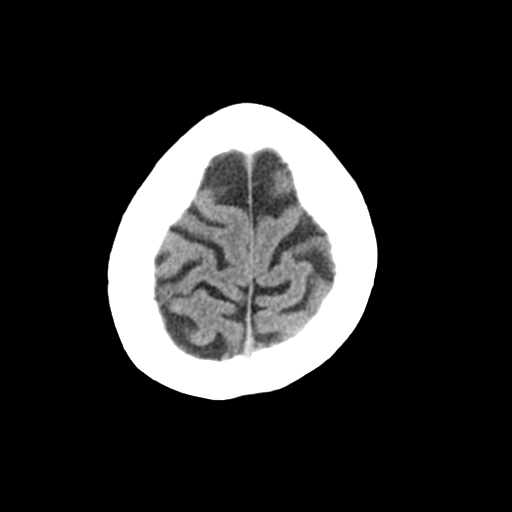
[im 25/31  bone]
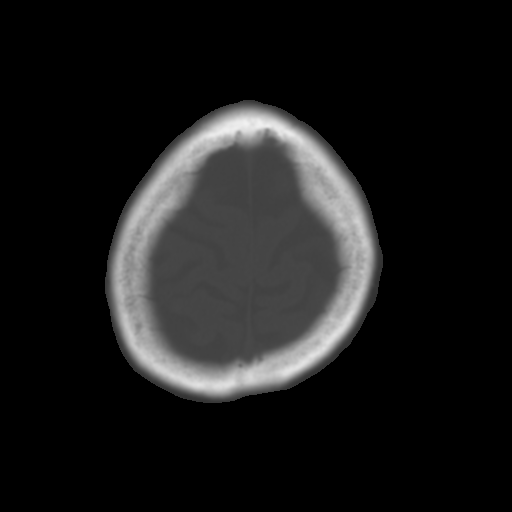
[im 28/31  brain]
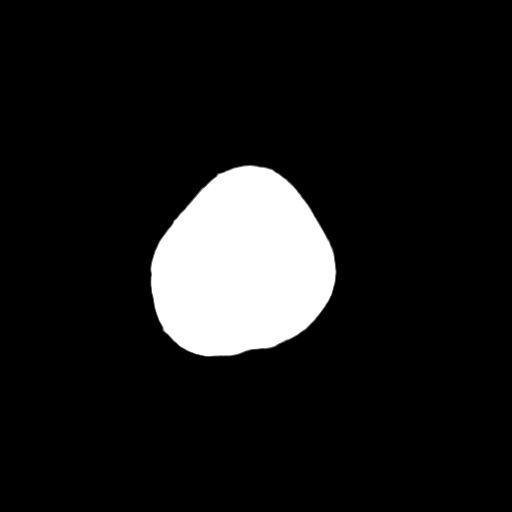

[Series 5: coronal soft · coronal · 0.33mm/px · 3 of 64 slices shown]
[im 22/64  brain]
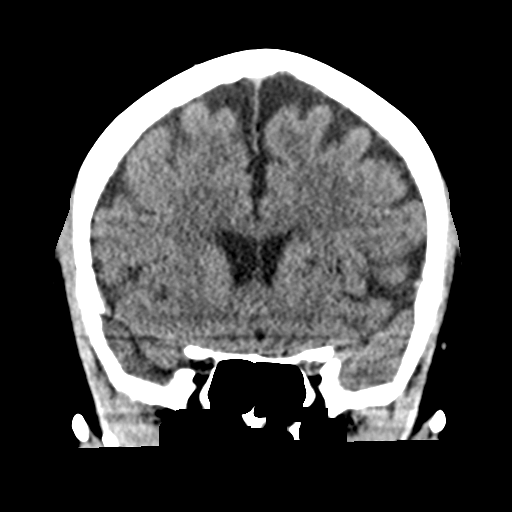
[im 29/64  brain]
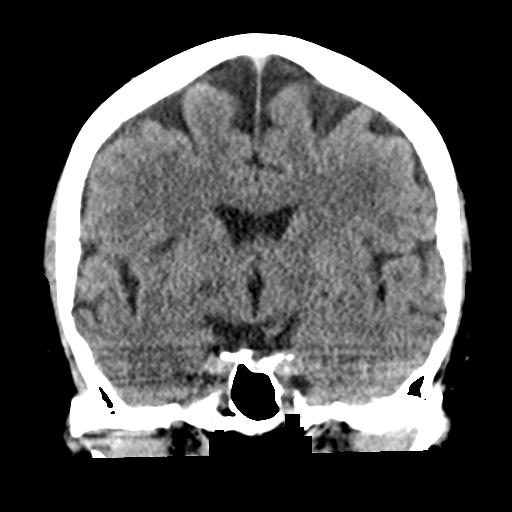
[im 36/64  brain]
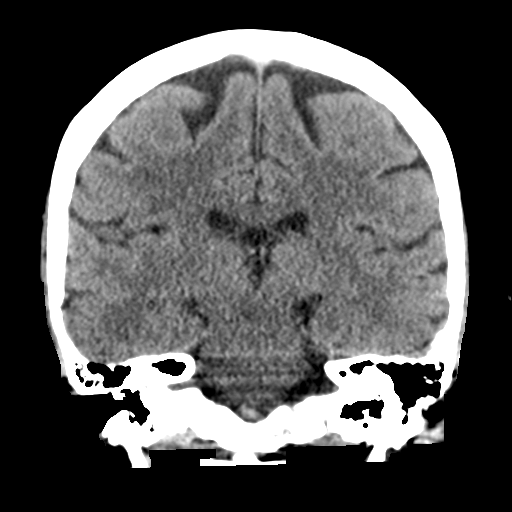

[Series 6: sagittal soft · sagittal · 0.33mm/px · 3 of 57 slices shown]
[im 19/57  brain]
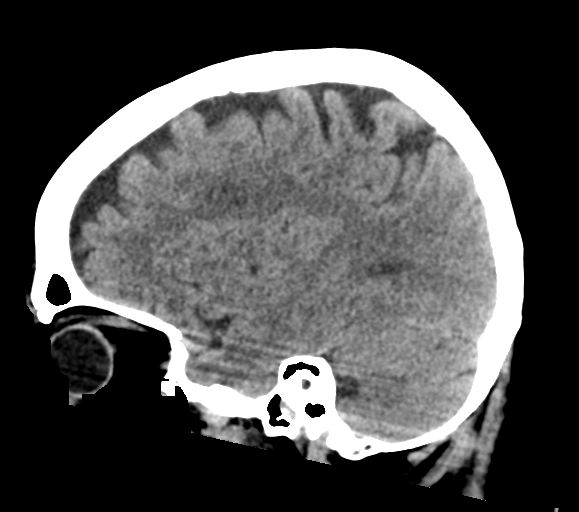
[im 29/57  brain]
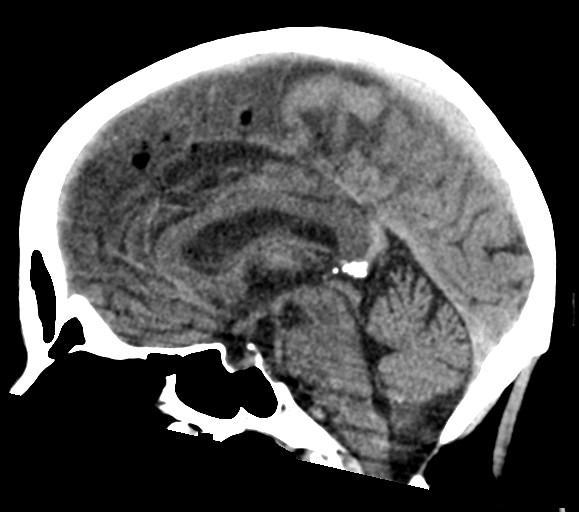
[im 38/57  brain]
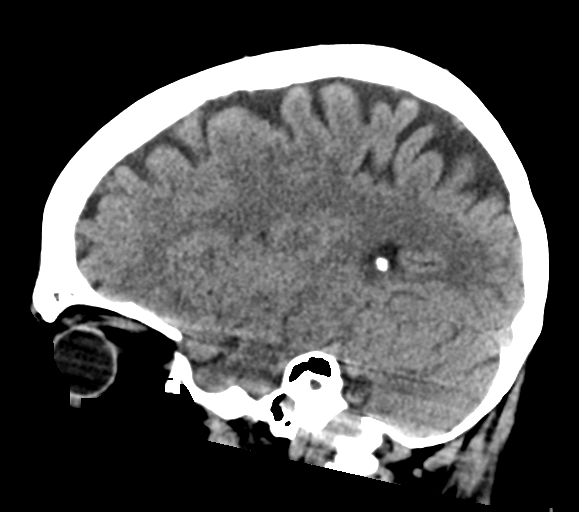

[16 of 47 positions shown; findings below may reference images not displayed]

FINDINGS: Brain: No acute infarct or intracranial hemorrhage. No mass lesion.
No midline shift, ventriculomegaly or extra-axial fluid collection.
Minimal chronic microvascular ischemic changes.

Vascular: No hyperdense vessel or unexpected calcification. Minimal
carotid siphon atherosclerotic calcifications.

Skull: Negative for fracture or focal lesion.

Sinuses/Orbits: Normal orbits. Clear paranasal sinuses. No mastoid
effusion.

Other: None.

ASPECTS (Alberta Stroke Program Early CT Score)

- Ganglionic level infarction (caudate, lentiform nuclei, internal
capsule, insula, M1-M3 cortex): 7

- Supraganglionic infarction (M4-M6 cortex): 3

Total score (0-10 with 10 being normal): Is 10
IMPRESSION: 1. No evidence of acute intracranial abnormality.
2. ASPECTS is 10.

am to provider Dr. Endsley via telephone, who verbally
acknowledged these results.

## 2021-10-23 IMAGING — US US CAROTID DUPLEX BILAT
1 series · 13 of 24 positions shown · non-contrast
Comparison: None.

CLINICAL DATA: TIA and hyperlipidemia.

EXAM:
BILATERAL CAROTID DUPLEX ULTRASOUND
TECHNIQUE: Gray scale imaging, color Doppler and duplex ultrasound were
performed of bilateral carotid and vertebral arteries in the neck.

[Series 1: us carotid bilateral · 13 of 74 slices shown]
[im 1/74]
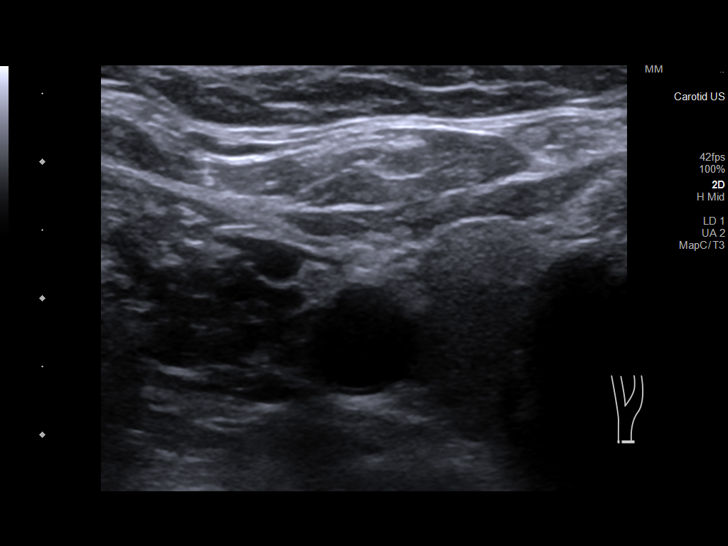
[im 7/74]
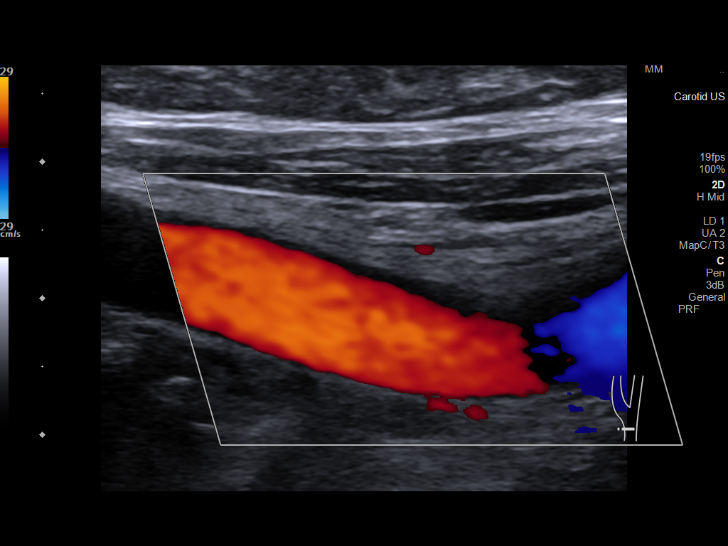
[im 13/74]
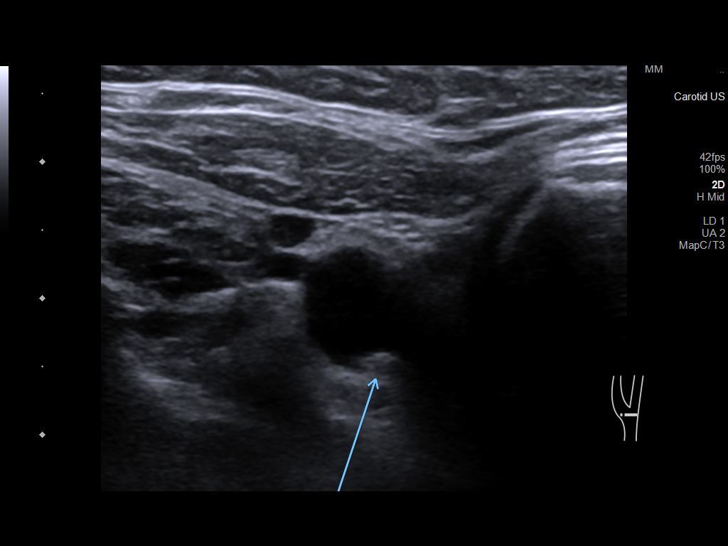
[im 20/74]
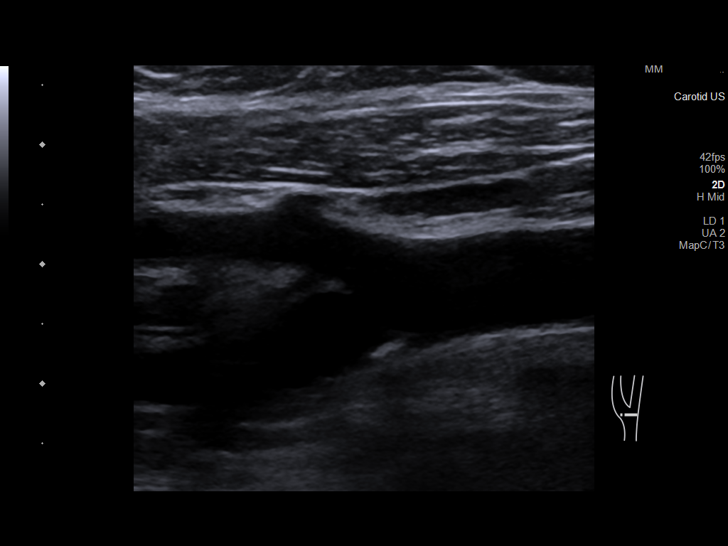
[im 26/74]
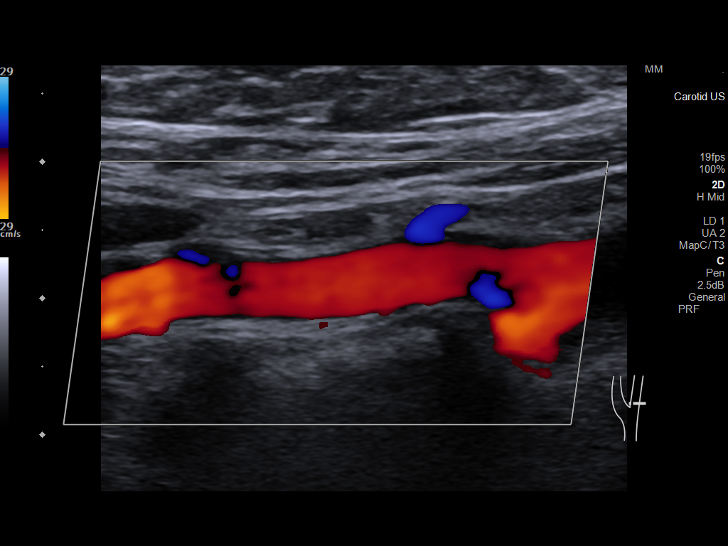
[im 32/74]
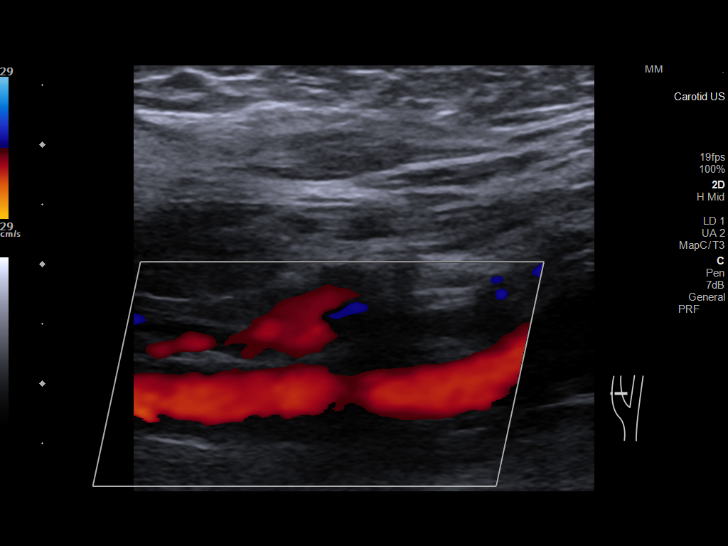
[im 39/74]
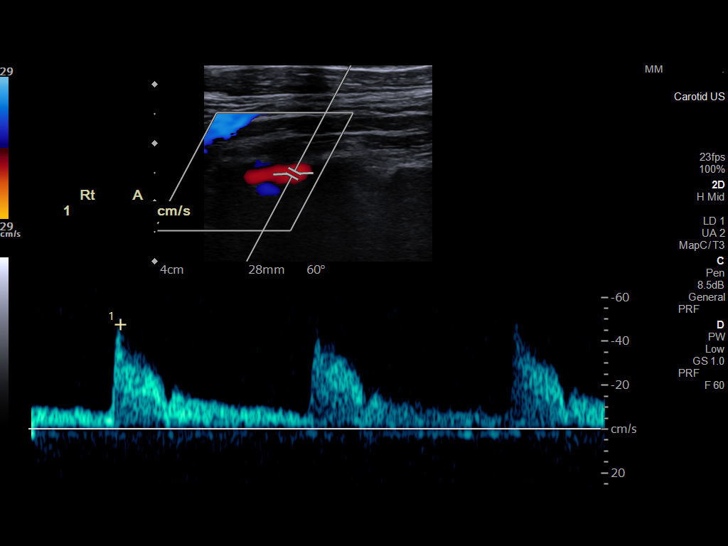
[im 42/74]
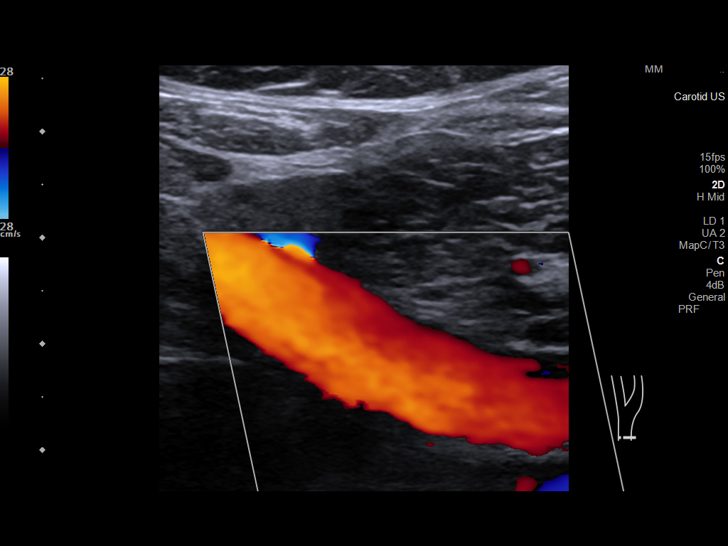
[im 48/74]
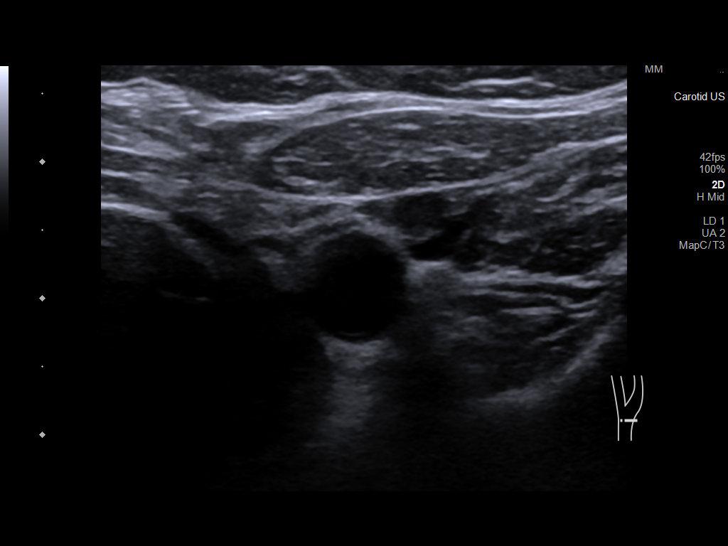
[im 54/74]
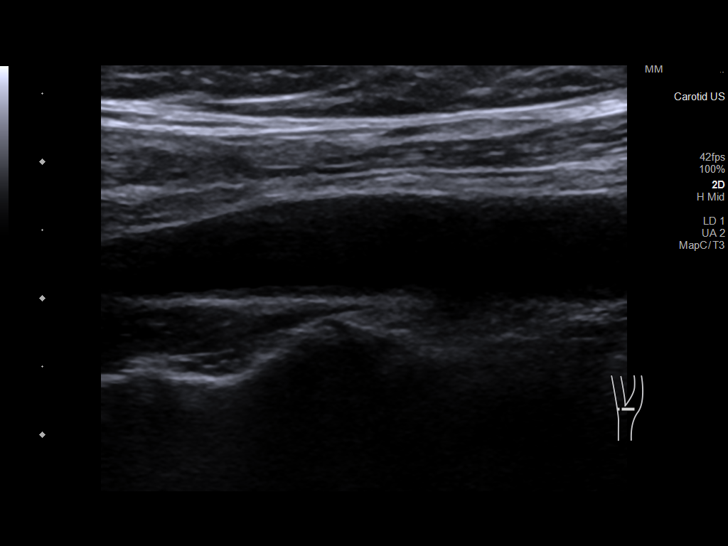
[im 61/74]
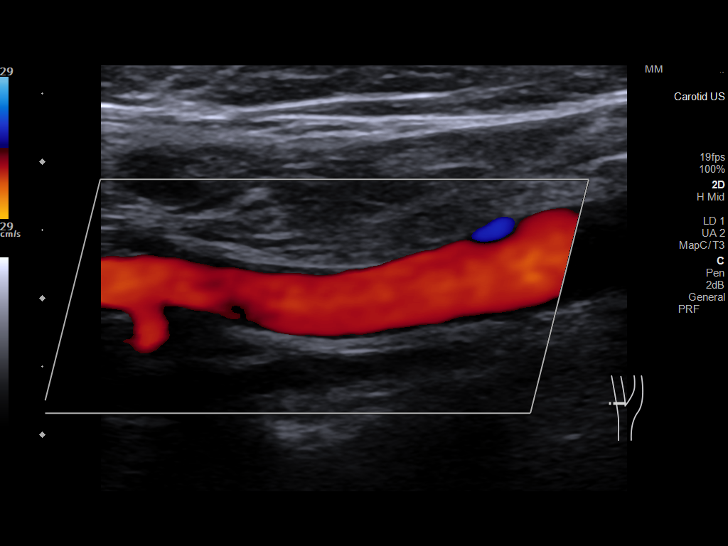
[im 67/74]
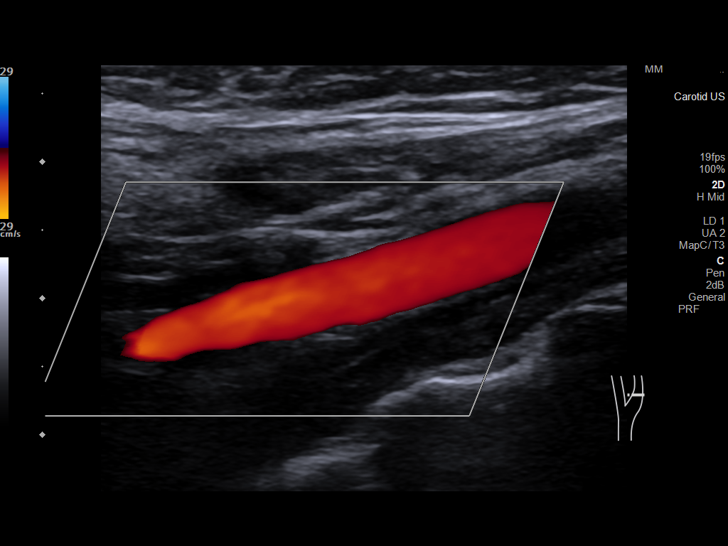
[im 74/74]
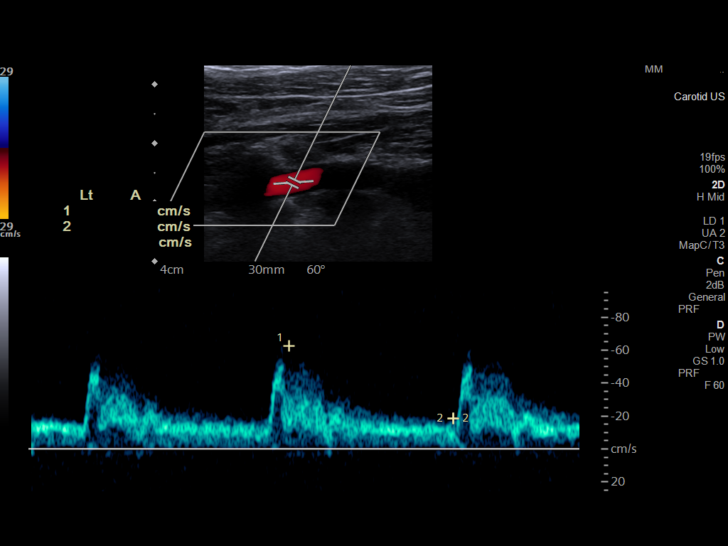

[13 of 24 positions shown; findings below may reference images not displayed]

FINDINGS: Criteria: Quantification of carotid stenosis is based on velocity
parameters that correlate the residual internal carotid diameter
with NASCET-based stenosis levels, using the diameter of the distal
internal carotid lumen as the denominator for stenosis measurement.

The following velocity measurements were obtained:

RIGHT

ICA:  111/32 cm/sec

CCA:  65/14 cm/sec

SYSTOLIC ICA/CCA RATIO:

ECA:  104 cm/sec

LEFT

ICA:  120/43 cm/sec

CCA:  73/16 cm/sec

SYSTOLIC ICA/CCA RATIO:

ECA:  84 cm/sec

RIGHT CAROTID ARTERY: Mild eccentric plaque present at the level of
the right carotid bulb. No evidence of right ICA plaque or stenosis.

RIGHT VERTEBRAL ARTERY: Antegrade flow with normal waveform and
velocity.

LEFT CAROTID ARTERY: No focal plaque identified. No evidence of left
carotid stenosis.

LEFT VERTEBRAL ARTERY: Antegrade flow with normal waveform and
velocity.
IMPRESSION: Mild plaque at the level of the right carotid bulb. No evidence of
right ICA or left-sided carotid stenosis.

## 2021-11-15 ENCOUNTER — Other Ambulatory Visit: Payer: Self-pay

## 2021-11-15 ENCOUNTER — Encounter: Payer: Self-pay | Admitting: Emergency Medicine

## 2021-11-15 ENCOUNTER — Ambulatory Visit
Admission: EM | Admit: 2021-11-15 | Discharge: 2021-11-15 | Disposition: A | Discharge status: Home / Self Care | Payer: Medicare Other | Attending: Nurse Practitioner | Admitting: Nurse Practitioner

## 2021-11-15 DIAGNOSIS — R21 Rash and other nonspecific skin eruption: Secondary | ICD-10-CM | POA: Diagnosis not present

## 2021-11-15 MED ORDER — PREDNISONE 10 MG (21) PO TBPK: ORAL_TABLET | Freq: Every day | ORAL | 0 refills | Status: AC

## 2021-11-15 MED ORDER — DEXAMETHASONE SODIUM PHOSPHATE 10 MG/ML IJ SOLN
10.0000 mg | INTRAMUSCULAR | Status: AC
  Administered 2021-11-15: 10 mg via INTRAMUSCULAR

## 2021-11-15 NOTE — ED Provider Notes (Signed)
[facility]    CSN: [id number] Arrival date & time: 11/15/21  1504      History   Chief Complaint Chief Complaint  Patient presents with   Rash    HPI Kristina [name] is a 76 y.o. female.   The history is provided by the patient.   Patient presents for complaints of rash that is been present for more than 1 week.  Patient states that she was trimming a bush near her home with a vinyl on it and believes that the fine was poison sumac.  Patient did see her primary care physician earlier this week and was given a steroid taper.  States that she was given prednisone 40 mg for 5 days.  She completed the last pills 5 days ago.  She states over the past week, the rash has reappeared.  She states the rash is on her left arm, her abdomen, and since the rash has become uncomfortable, almost painful.  She denies exposure to new soaps, foods, medications, detergents, she further denies fever, chills, chest pain, abdominal pain, nausea, vomiting, or diarrhea.  Patient states she did reach out to her primary care physician's office this morning to see if something else could be called in, but states that she call back this afternoon and was told she needed to be seen.  Patient reports a history of prediabetes.  Past Medical History:  Diagnosis Date   Hyperlipidemia    Seizure ([place])    2 "silent seizures"per patient 8 years apart   TIA (transient ischemic attack)     Patient Active Problem List   Diagnosis Date Noted   Altered mental status 02/06/2020   Acute encephalopathy 02/06/2020   Personal history of colonic polyps    Brain TIA 05/18/2012   Amnesia memory loss 05/18/2012   Hyperlipidemia 05/18/2012   Tubulovillous adenoma polyp of colon 02/12/2012    Past Surgical History:  Procedure Laterality Date   COLONOSCOPY  12/22/2006   SLF:  8 mm sigmoid colon polyp removed via snare cautery/ Otherwise no masses, inflammatory changes, diverticula , no internal hemorrhoids.  Path-tubulovillous adenomas   COLONOSCOPY  03/16/2012   Procedure: COLONOSCOPY;  Surgeon: Danie Binder, MD;  Location: [facility];  Service: Endoscopy;  Laterality: N/A;  10:00-changed to 11:00 Darius Bump to notifiy pt   COLONOSCOPY N/A 04/02/2018   Procedure: COLONOSCOPY;  Surgeon: Danie Binder, MD;  Location: [facility];  Service: Endoscopy;  Laterality: N/A;  9:30   EYE SURGERY Bilateral    cataract   POLYPECTOMY  04/02/2018   Procedure: POLYPECTOMY;  Surgeon: Danie Binder, MD;  Location: [facility];  Service: Endoscopy;;   TONSILLECTOMY      OB History   No obstetric history on file.      Home Medications    Prior to Admission medications   Medication Sig Start Date End Date Taking? Authorizing Provider  predniSONE (STERAPRED UNI-PAK 21 TAB) 10 MG (21) TBPK tablet Take by mouth daily for 6 days. Take 6 tablets with breakfast on day 1. Take 5 tablets with breakfast on day 2. Take 4 tablets with breakfast on day 3. Take 3 tablets with breakfast on day 4. Take 2 tablets with breakfast on day 5. Take 1 tablet with breakfast on day 6. 11/15/21 11/21/21 Yes Cali Hope-Warren, Alda Lea, NP  acetaminophen (TYLENOL) 500 MG tablet Take 1,000 mg by mouth daily as needed for moderate pain or headache.    [provider]  amLODipine (NORVASC) 2.5 MG tablet Take 2.5 mg by mouth daily.  09/21/17   [provider]  conjugated estrogens (PREMARIN) vaginal cream Place 1 Applicatorful vaginally every other day. 07/19/19   McKenzie, Candee Furbish, MD  CRESTOR 10 MG tablet Take 10 mg by mouth every evening.  12/17/11   [provider]  famotidine (PEPCID) 10 MG tablet Take 10 mg by mouth daily.    [provider]  levocetirizine (XYZAL) 5 MG tablet Take 5 mg by mouth daily. 01/11/20   [provider]  meloxicam (MOBIC) 15 MG tablet Take 15 mg by mouth every evening.    [provider]  Multiple Vitamin (MULTIVITAMIN WITH MINERALS) TABS tablet Take  1 tablet by mouth daily.    [provider]  Omega-3 Fatty Acids (FISH OIL) 1000 MG CAPS Take 1,000 mg by mouth daily.    [provider]  sodium chloride (OCEAN) 0.65 % nasal spray Place 1 spray into the nose as needed for congestion.     [provider]    Family History Family History  Problem Relation Age of Onset   Lung cancer Father    Colon polyps Father    Colon cancer Other        ?maternal grandmother may have had colon cancer    Social History Social History   Tobacco Use   Smoking status: Never   Smokeless tobacco: Never  Vaping Use   Vaping Use: Never used  Substance Use Topics   Alcohol use: No   Drug use: No     Allergies   Lactose intolerance (gi)   Review of Systems Review of Systems Per HPI  Physical Exam Triage Vital Signs ED Triage Vitals  Enc Vitals Group     BP 11/15/21 1534 120/77     Pulse Rate 11/15/21 1534 (!) 59     Resp 11/15/21 1534 20     Temp 11/15/21 1534 97.9 F (36.6 C)     Temp Source 11/15/21 1534 Oral     SpO2 11/15/21 1534 96 %     Weight --      Height --      Head Circumference --      Peak Flow --      Pain Score 11/15/21 1532 4     Pain Loc --      Pain Edu? --      Excl. in [place]? --    No data found.  Updated Vital Signs BP 120/77 (BP Location: Right Arm)   Pulse (!) 59   Temp 97.9 F (36.6 C) (Oral)   Resp 20   SpO2 96%   Visual Acuity Right Eye Distance:   Left Eye Distance:   Bilateral Distance:    Right Eye Near:   Left Eye Near:    Bilateral Near:     Physical Exam Vitals and nursing note reviewed.  Constitutional:      General: She is not in acute distress.    Appearance: Normal appearance.  Eyes:     Extraocular Movements: Extraocular movements intact.     Conjunctiva/sclera: Conjunctivae normal.     Pupils: Pupils are equal, round, and reactive to light.  Cardiovascular:     Rate and Rhythm: Normal rate and regular rhythm.     Heart sounds: Normal heart  sounds.  Pulmonary:     Effort: Pulmonary effort is normal.     Breath sounds: Normal breath sounds.  Abdominal:     General:  Bowel sounds are normal.     Palpations: Abdomen is soft.  Skin:    General: Skin is warm and dry.     Findings: Rash present.     Comments: Erythematous patches no located to the right chest, abdomen, and left antecubital region.  Area is flat, blanchable.  There is no oozing, fluctuance, or drainage present.  Neurological:     General: No focal deficit present.     Mental Status: She is alert and oriented to person, place, and time.  Psychiatric:        Mood and Affect: Mood normal.        Behavior: Behavior normal.      UC Treatments / Results  Labs (all labs ordered are listed, but only abnormal results are displayed) Labs Reviewed - No data to display  EKG   Radiology No results found.  Procedures Procedures (including critical care time)  Medications Ordered in UC Medications  dexamethasone (DECADRON) injection 10 mg (10 mg Intramuscular Given 11/15/21 1614)    Initial Impression / Assessment and Plan / UC Course  I have reviewed the triage vital signs and the nursing notes.  Pertinent labs & imaging results that were available during my care of the patient were reviewed by me and considered in my medical decision making (see chart for details).  Patient presents with a rash has been present for more than 1 week patient reports that she came into contact with poison sumac.  Patient's rash is annular, erythematous and maculopapular, rash is blanchable.  There is no oozing or drainage present. There is no fluctuance or drainage present.  Rash is itchy.  Symptoms are consistent with possible contact dermatitis due to plants.  Patient was given Decadron injection in the clinic and started on prednisone.  Supportive care recommendations were provided to the patient.  Patient advised to follow-up if symptoms do not improve.  Final Clinical  Impressions(s) / UC Diagnoses   Final diagnoses:  Rash and nonspecific skin eruption     Discharge Instructions      Take medication as prescribed. May also take over-the-counter Zyrtec to help with itching for the daytime and Benadryl at bedtime. Take medication as directed.  Avoid hot baths or showers while symptoms persist.  Recommend taking lukewarm baths. May apply cool cloths to the area to help with itching or discomfort. Avoid scratching, rubbing, or manipulating the areas while symptoms persist. Recommend Aveeno colloidal oatmeal bath to use to help with drying and itching. Follow up with your primary care physician if your symptoms fail to improve.     ED Prescriptions     Medication Sig Dispense Auth. Provider   predniSONE (STERAPRED UNI-PAK 21 TAB) 10 MG (21) TBPK tablet Take by mouth daily for 6 days. Take 6 tablets with breakfast on day 1. Take 5 tablets with breakfast on day 2. Take 4 tablets with breakfast on day 3. Take 3 tablets with breakfast on day 4. Take 2 tablets with breakfast on day 5. Take 1 tablet with breakfast on day 6. 21 tablet Donella Pascarella-Warren, Alda Lea, NP      PDMP not reviewed this encounter.   Tish Men, NP 11/15/21 1630

## 2021-11-15 NOTE — Discharge Instructions (Addendum)
Take medication as prescribed. May also take over-the-counter Zyrtec to help with itching for the daytime and Benadryl at bedtime. Take medication as directed.  Avoid hot baths or showers while symptoms persist.  Recommend taking lukewarm baths. May apply cool cloths to the area to help with itching or discomfort. Avoid scratching, rubbing, or manipulating the areas while symptoms persist. Recommend Aveeno colloidal oatmeal bath to use to help with drying and itching. Follow up with your primary care physician if your symptoms fail to improve.

## 2021-11-15 NOTE — ED Triage Notes (Signed)
Pt reports was trimming a bush near a tree with a vine on it. Pt reports cut vine from tree and believes had reaction to poison sumac. Pt was seen by pcp and reports was given steroid taper.last dose Monday.   Pt reports rash has reappeared intermittently this week.

## 2021-12-11 DIAGNOSIS — K219 Gastro-esophageal reflux disease without esophagitis: Secondary | ICD-10-CM | POA: Diagnosis not present

## 2021-12-11 DIAGNOSIS — E039 Hypothyroidism, unspecified: Secondary | ICD-10-CM | POA: Diagnosis not present

## 2021-12-11 DIAGNOSIS — M179 Osteoarthritis of knee, unspecified: Secondary | ICD-10-CM | POA: Diagnosis not present

## 2021-12-11 DIAGNOSIS — Z7689 Persons encountering health services in other specified circumstances: Secondary | ICD-10-CM | POA: Diagnosis not present

## 2021-12-11 DIAGNOSIS — R7303 Prediabetes: Secondary | ICD-10-CM | POA: Diagnosis not present

## 2021-12-11 DIAGNOSIS — J069 Acute upper respiratory infection, unspecified: Secondary | ICD-10-CM | POA: Diagnosis not present

## 2021-12-11 DIAGNOSIS — E785 Hyperlipidemia, unspecified: Secondary | ICD-10-CM | POA: Diagnosis not present

## 2021-12-11 DIAGNOSIS — Z0189 Encounter for other specified special examinations: Secondary | ICD-10-CM | POA: Diagnosis not present

## 2021-12-11 DIAGNOSIS — J302 Other seasonal allergic rhinitis: Secondary | ICD-10-CM | POA: Diagnosis not present

## 2021-12-25 DIAGNOSIS — Z23 Encounter for immunization: Secondary | ICD-10-CM | POA: Diagnosis not present

## 2022-01-09 DIAGNOSIS — M179 Osteoarthritis of knee, unspecified: Secondary | ICD-10-CM | POA: Diagnosis not present

## 2022-01-09 DIAGNOSIS — R7303 Prediabetes: Secondary | ICD-10-CM | POA: Diagnosis not present

## 2022-01-09 DIAGNOSIS — E785 Hyperlipidemia, unspecified: Secondary | ICD-10-CM | POA: Diagnosis not present

## 2022-01-09 DIAGNOSIS — E039 Hypothyroidism, unspecified: Secondary | ICD-10-CM | POA: Diagnosis not present

## 2022-01-16 DIAGNOSIS — J302 Other seasonal allergic rhinitis: Secondary | ICD-10-CM | POA: Diagnosis not present

## 2022-01-16 DIAGNOSIS — E785 Hyperlipidemia, unspecified: Secondary | ICD-10-CM | POA: Diagnosis not present

## 2022-01-16 DIAGNOSIS — R7303 Prediabetes: Secondary | ICD-10-CM | POA: Diagnosis not present

## 2022-01-16 DIAGNOSIS — M179 Osteoarthritis of knee, unspecified: Secondary | ICD-10-CM | POA: Diagnosis not present

## 2022-01-16 DIAGNOSIS — K219 Gastro-esophageal reflux disease without esophagitis: Secondary | ICD-10-CM | POA: Diagnosis not present

## 2022-01-16 DIAGNOSIS — I10 Essential (primary) hypertension: Secondary | ICD-10-CM | POA: Diagnosis not present

## 2022-01-16 DIAGNOSIS — E039 Hypothyroidism, unspecified: Secondary | ICD-10-CM | POA: Diagnosis not present

## 2022-02-05 DIAGNOSIS — H33301 Unspecified retinal break, right eye: Secondary | ICD-10-CM | POA: Diagnosis not present

## 2022-02-05 DIAGNOSIS — H43813 Vitreous degeneration, bilateral: Secondary | ICD-10-CM | POA: Diagnosis not present

## 2022-02-05 DIAGNOSIS — H524 Presbyopia: Secondary | ICD-10-CM | POA: Diagnosis not present

## 2022-02-05 DIAGNOSIS — H52203 Unspecified astigmatism, bilateral: Secondary | ICD-10-CM | POA: Diagnosis not present

## 2022-02-05 DIAGNOSIS — Z961 Presence of intraocular lens: Secondary | ICD-10-CM | POA: Diagnosis not present

## 2022-03-13 DIAGNOSIS — D2371 Other benign neoplasm of skin of right lower limb, including hip: Secondary | ICD-10-CM | POA: Diagnosis not present

## 2022-03-13 DIAGNOSIS — Z1283 Encounter for screening for malignant neoplasm of skin: Secondary | ICD-10-CM | POA: Diagnosis not present

## 2022-03-13 DIAGNOSIS — L570 Actinic keratosis: Secondary | ICD-10-CM | POA: Diagnosis not present

## 2022-03-13 DIAGNOSIS — L821 Other seborrheic keratosis: Secondary | ICD-10-CM | POA: Diagnosis not present

## 2022-03-13 DIAGNOSIS — Z808 Family history of malignant neoplasm of other organs or systems: Secondary | ICD-10-CM | POA: Diagnosis not present

## 2022-03-13 DIAGNOSIS — D239 Other benign neoplasm of skin, unspecified: Secondary | ICD-10-CM | POA: Diagnosis not present

## 2022-03-13 DIAGNOSIS — D229 Melanocytic nevi, unspecified: Secondary | ICD-10-CM | POA: Diagnosis not present

## 2022-03-13 DIAGNOSIS — D235 Other benign neoplasm of skin of trunk: Secondary | ICD-10-CM | POA: Diagnosis not present

## 2022-06-25 DIAGNOSIS — M1712 Unilateral primary osteoarthritis, left knee: Secondary | ICD-10-CM | POA: Diagnosis not present

## 2022-07-02 DIAGNOSIS — M1712 Unilateral primary osteoarthritis, left knee: Secondary | ICD-10-CM | POA: Diagnosis not present

## 2022-07-08 DIAGNOSIS — W57XXXA Bitten or stung by nonvenomous insect and other nonvenomous arthropods, initial encounter: Secondary | ICD-10-CM | POA: Diagnosis not present

## 2022-07-08 DIAGNOSIS — M1712 Unilateral primary osteoarthritis, left knee: Secondary | ICD-10-CM | POA: Diagnosis not present

## 2022-07-08 DIAGNOSIS — S90562A Insect bite (nonvenomous), left ankle, initial encounter: Secondary | ICD-10-CM | POA: Diagnosis not present

## 2022-07-09 DIAGNOSIS — M1712 Unilateral primary osteoarthritis, left knee: Secondary | ICD-10-CM | POA: Diagnosis not present

## 2022-07-11 DIAGNOSIS — E785 Hyperlipidemia, unspecified: Secondary | ICD-10-CM | POA: Diagnosis not present

## 2022-07-11 DIAGNOSIS — R7303 Prediabetes: Secondary | ICD-10-CM | POA: Diagnosis not present

## 2022-07-11 DIAGNOSIS — E039 Hypothyroidism, unspecified: Secondary | ICD-10-CM | POA: Diagnosis not present

## 2022-07-11 DIAGNOSIS — M179 Osteoarthritis of knee, unspecified: Secondary | ICD-10-CM | POA: Diagnosis not present

## 2022-07-17 ENCOUNTER — Other Ambulatory Visit (HOSPITAL_COMMUNITY): Payer: Self-pay | Admitting: Family Medicine

## 2022-07-17 DIAGNOSIS — Z13820 Encounter for screening for osteoporosis: Secondary | ICD-10-CM

## 2022-07-17 DIAGNOSIS — M179 Osteoarthritis of knee, unspecified: Secondary | ICD-10-CM | POA: Diagnosis not present

## 2022-07-17 DIAGNOSIS — R7303 Prediabetes: Secondary | ICD-10-CM | POA: Diagnosis not present

## 2022-07-17 DIAGNOSIS — J302 Other seasonal allergic rhinitis: Secondary | ICD-10-CM | POA: Diagnosis not present

## 2022-07-17 DIAGNOSIS — K219 Gastro-esophageal reflux disease without esophagitis: Secondary | ICD-10-CM | POA: Diagnosis not present

## 2022-07-17 DIAGNOSIS — E785 Hyperlipidemia, unspecified: Secondary | ICD-10-CM | POA: Diagnosis not present

## 2022-07-17 DIAGNOSIS — Z79899 Other long term (current) drug therapy: Secondary | ICD-10-CM | POA: Diagnosis not present

## 2022-07-17 DIAGNOSIS — E039 Hypothyroidism, unspecified: Secondary | ICD-10-CM | POA: Diagnosis not present

## 2022-07-17 DIAGNOSIS — I10 Essential (primary) hypertension: Secondary | ICD-10-CM | POA: Diagnosis not present

## 2022-07-24 ENCOUNTER — Ambulatory Visit (HOSPITAL_COMMUNITY)
Admission: RE | Admit: 2022-07-24 | Discharge: 2022-07-24 | Disposition: A | Discharge status: Home / Self Care | Payer: Medicare Other | Source: Ambulatory Visit | Attending: Family Medicine | Admitting: Family Medicine

## 2022-07-24 DIAGNOSIS — Z13820 Encounter for screening for osteoporosis: Secondary | ICD-10-CM | POA: Insufficient documentation

## 2022-07-24 DIAGNOSIS — Z780 Asymptomatic menopausal state: Secondary | ICD-10-CM | POA: Insufficient documentation

## 2022-07-24 DIAGNOSIS — M8589 Other specified disorders of bone density and structure, multiple sites: Secondary | ICD-10-CM | POA: Insufficient documentation

## 2022-09-08 DIAGNOSIS — Z713 Dietary counseling and surveillance: Secondary | ICD-10-CM | POA: Diagnosis not present

## 2022-09-08 DIAGNOSIS — R350 Frequency of micturition: Secondary | ICD-10-CM | POA: Diagnosis not present

## 2022-09-24 DIAGNOSIS — R109 Unspecified abdominal pain: Secondary | ICD-10-CM | POA: Diagnosis not present

## 2022-12-04 DIAGNOSIS — M1711 Unilateral primary osteoarthritis, right knee: Secondary | ICD-10-CM | POA: Diagnosis not present

## 2022-12-04 DIAGNOSIS — M170 Bilateral primary osteoarthritis of knee: Secondary | ICD-10-CM | POA: Diagnosis not present

## 2023-01-07 DIAGNOSIS — E785 Hyperlipidemia, unspecified: Secondary | ICD-10-CM | POA: Diagnosis not present

## 2023-01-07 DIAGNOSIS — E039 Hypothyroidism, unspecified: Secondary | ICD-10-CM | POA: Diagnosis not present

## 2023-01-07 DIAGNOSIS — M179 Osteoarthritis of knee, unspecified: Secondary | ICD-10-CM | POA: Diagnosis not present

## 2023-01-07 DIAGNOSIS — E559 Vitamin D deficiency, unspecified: Secondary | ICD-10-CM | POA: Diagnosis not present

## 2023-01-07 DIAGNOSIS — R7303 Prediabetes: Secondary | ICD-10-CM | POA: Diagnosis not present

## 2023-01-09 ENCOUNTER — Other Ambulatory Visit (HOSPITAL_COMMUNITY): Payer: Self-pay | Admitting: Internal Medicine

## 2023-01-09 DIAGNOSIS — Z1231 Encounter for screening mammogram for malignant neoplasm of breast: Secondary | ICD-10-CM

## 2023-01-13 DIAGNOSIS — E039 Hypothyroidism, unspecified: Secondary | ICD-10-CM | POA: Diagnosis not present

## 2023-01-13 DIAGNOSIS — M179 Osteoarthritis of knee, unspecified: Secondary | ICD-10-CM | POA: Diagnosis not present

## 2023-01-13 DIAGNOSIS — E1165 Type 2 diabetes mellitus with hyperglycemia: Secondary | ICD-10-CM | POA: Diagnosis not present

## 2023-01-13 DIAGNOSIS — E785 Hyperlipidemia, unspecified: Secondary | ICD-10-CM | POA: Diagnosis not present

## 2023-01-13 DIAGNOSIS — K429 Umbilical hernia without obstruction or gangrene: Secondary | ICD-10-CM | POA: Diagnosis not present

## 2023-01-13 DIAGNOSIS — I10 Essential (primary) hypertension: Secondary | ICD-10-CM | POA: Diagnosis not present

## 2023-01-13 DIAGNOSIS — Z23 Encounter for immunization: Secondary | ICD-10-CM | POA: Diagnosis not present

## 2023-01-13 DIAGNOSIS — J302 Other seasonal allergic rhinitis: Secondary | ICD-10-CM | POA: Diagnosis not present

## 2023-01-19 ENCOUNTER — Encounter (HOSPITAL_COMMUNITY): Payer: Self-pay

## 2023-01-19 ENCOUNTER — Ambulatory Visit (HOSPITAL_COMMUNITY)
Admission: RE | Admit: 2023-01-19 | Discharge: 2023-01-19 | Disposition: A | Discharge status: Home / Self Care | Payer: Medicare Other | Source: Ambulatory Visit | Attending: Internal Medicine | Admitting: Internal Medicine

## 2023-01-19 DIAGNOSIS — Z1231 Encounter for screening mammogram for malignant neoplasm of breast: Secondary | ICD-10-CM | POA: Insufficient documentation

## 2023-03-12 ENCOUNTER — Encounter: Payer: Self-pay | Admitting: *Deleted

## 2023-04-17 DIAGNOSIS — E785 Hyperlipidemia, unspecified: Secondary | ICD-10-CM | POA: Diagnosis not present

## 2023-04-17 DIAGNOSIS — M179 Osteoarthritis of knee, unspecified: Secondary | ICD-10-CM | POA: Diagnosis not present

## 2023-04-17 DIAGNOSIS — E039 Hypothyroidism, unspecified: Secondary | ICD-10-CM | POA: Diagnosis not present

## 2023-04-17 DIAGNOSIS — E559 Vitamin D deficiency, unspecified: Secondary | ICD-10-CM | POA: Diagnosis not present

## 2023-04-17 DIAGNOSIS — E1165 Type 2 diabetes mellitus with hyperglycemia: Secondary | ICD-10-CM | POA: Diagnosis not present

## 2023-04-24 DIAGNOSIS — E039 Hypothyroidism, unspecified: Secondary | ICD-10-CM | POA: Diagnosis not present

## 2023-04-24 DIAGNOSIS — E1165 Type 2 diabetes mellitus with hyperglycemia: Secondary | ICD-10-CM | POA: Diagnosis not present

## 2023-04-24 DIAGNOSIS — J302 Other seasonal allergic rhinitis: Secondary | ICD-10-CM | POA: Diagnosis not present

## 2023-04-24 DIAGNOSIS — I10 Essential (primary) hypertension: Secondary | ICD-10-CM | POA: Diagnosis not present

## 2023-04-24 DIAGNOSIS — M179 Osteoarthritis of knee, unspecified: Secondary | ICD-10-CM | POA: Diagnosis not present

## 2023-04-24 DIAGNOSIS — E785 Hyperlipidemia, unspecified: Secondary | ICD-10-CM | POA: Diagnosis not present

## 2023-04-24 DIAGNOSIS — K429 Umbilical hernia without obstruction or gangrene: Secondary | ICD-10-CM | POA: Diagnosis not present

## 2023-05-14 DIAGNOSIS — M170 Bilateral primary osteoarthritis of knee: Secondary | ICD-10-CM | POA: Diagnosis not present

## 2023-07-30 DIAGNOSIS — H04123 Dry eye syndrome of bilateral lacrimal glands: Secondary | ICD-10-CM | POA: Diagnosis not present

## 2023-07-30 DIAGNOSIS — H43813 Vitreous degeneration, bilateral: Secondary | ICD-10-CM | POA: Diagnosis not present

## 2023-07-30 DIAGNOSIS — H33301 Unspecified retinal break, right eye: Secondary | ICD-10-CM | POA: Diagnosis not present

## 2023-07-30 DIAGNOSIS — Z961 Presence of intraocular lens: Secondary | ICD-10-CM | POA: Diagnosis not present

## 2023-07-30 DIAGNOSIS — H524 Presbyopia: Secondary | ICD-10-CM | POA: Diagnosis not present

## 2023-09-23 DIAGNOSIS — M170 Bilateral primary osteoarthritis of knee: Secondary | ICD-10-CM | POA: Diagnosis not present

## 2023-10-16 DIAGNOSIS — E039 Hypothyroidism, unspecified: Secondary | ICD-10-CM | POA: Diagnosis not present

## 2023-10-16 DIAGNOSIS — E785 Hyperlipidemia, unspecified: Secondary | ICD-10-CM | POA: Diagnosis not present

## 2023-10-16 DIAGNOSIS — E1165 Type 2 diabetes mellitus with hyperglycemia: Secondary | ICD-10-CM | POA: Diagnosis not present

## 2023-10-16 DIAGNOSIS — M179 Osteoarthritis of knee, unspecified: Secondary | ICD-10-CM | POA: Diagnosis not present

## 2023-10-22 DIAGNOSIS — L989 Disorder of the skin and subcutaneous tissue, unspecified: Secondary | ICD-10-CM | POA: Diagnosis not present

## 2023-10-22 DIAGNOSIS — E1165 Type 2 diabetes mellitus with hyperglycemia: Secondary | ICD-10-CM | POA: Diagnosis not present

## 2023-10-22 DIAGNOSIS — E785 Hyperlipidemia, unspecified: Secondary | ICD-10-CM | POA: Diagnosis not present

## 2023-10-22 DIAGNOSIS — I10 Essential (primary) hypertension: Secondary | ICD-10-CM | POA: Diagnosis not present

## 2023-10-22 DIAGNOSIS — Z79899 Other long term (current) drug therapy: Secondary | ICD-10-CM | POA: Diagnosis not present

## 2023-10-22 DIAGNOSIS — K429 Umbilical hernia without obstruction or gangrene: Secondary | ICD-10-CM | POA: Diagnosis not present

## 2023-10-22 DIAGNOSIS — J302 Other seasonal allergic rhinitis: Secondary | ICD-10-CM | POA: Diagnosis not present

## 2023-10-22 DIAGNOSIS — M179 Osteoarthritis of knee, unspecified: Secondary | ICD-10-CM | POA: Diagnosis not present

## 2023-10-22 DIAGNOSIS — Z713 Dietary counseling and surveillance: Secondary | ICD-10-CM | POA: Diagnosis not present

## 2023-10-22 DIAGNOSIS — E039 Hypothyroidism, unspecified: Secondary | ICD-10-CM | POA: Diagnosis not present

## 2023-12-29 ENCOUNTER — Other Ambulatory Visit (HOSPITAL_COMMUNITY): Payer: Self-pay | Admitting: Internal Medicine

## 2023-12-29 DIAGNOSIS — Z1231 Encounter for screening mammogram for malignant neoplasm of breast: Secondary | ICD-10-CM

## 2024-01-22 ENCOUNTER — Ambulatory Visit (HOSPITAL_COMMUNITY)

## 2024-02-08 ENCOUNTER — Ambulatory Visit (HOSPITAL_COMMUNITY)

## 2024-02-15 ENCOUNTER — Encounter (HOSPITAL_COMMUNITY): Payer: Self-pay

## 2024-02-15 ENCOUNTER — Inpatient Hospital Stay (HOSPITAL_COMMUNITY): Admission: RE | Admit: 2024-02-15 | Discharge: 2024-02-15 | Attending: Internal Medicine | Admitting: Internal Medicine

## 2024-02-15 DIAGNOSIS — Z1231 Encounter for screening mammogram for malignant neoplasm of breast: Secondary | ICD-10-CM | POA: Diagnosis present

## 2024-02-22 ENCOUNTER — Other Ambulatory Visit (HOSPITAL_COMMUNITY): Payer: Self-pay | Admitting: Internal Medicine

## 2024-02-22 DIAGNOSIS — R928 Other abnormal and inconclusive findings on diagnostic imaging of breast: Secondary | ICD-10-CM

## 2024-03-10 ENCOUNTER — Ambulatory Visit (HOSPITAL_COMMUNITY)
Admission: RE | Admit: 2024-03-10 | Discharge: 2024-03-10 | Disposition: A | Discharge status: Home / Self Care | Source: Ambulatory Visit | Attending: Internal Medicine | Admitting: Internal Medicine

## 2024-03-10 ENCOUNTER — Encounter (HOSPITAL_COMMUNITY): Payer: Self-pay

## 2024-03-10 DIAGNOSIS — R928 Other abnormal and inconclusive findings on diagnostic imaging of breast: Secondary | ICD-10-CM
